# Patient Record
Sex: Male | Born: 1955 | Race: White | Hispanic: No | Marital: Married | Smoking: Never smoker
Health system: Southern US, Community
[De-identification: ages and names within clinical notes are randomized; demographics above are authoritative.]

## PROBLEM LIST (undated history)

## (undated) DIAGNOSIS — K219 Gastro-esophageal reflux disease without esophagitis: Secondary | ICD-10-CM

## (undated) DIAGNOSIS — M1990 Unspecified osteoarthritis, unspecified site: Secondary | ICD-10-CM

## (undated) DIAGNOSIS — H9190 Unspecified hearing loss, unspecified ear: Secondary | ICD-10-CM

## (undated) DIAGNOSIS — I10 Essential (primary) hypertension: Secondary | ICD-10-CM

## (undated) HISTORY — PX: MANDIBLE FRACTURE SURGERY: SHX706

## (undated) HISTORY — DX: Gastro-esophageal reflux disease without esophagitis: K21.9

## (undated) HISTORY — DX: Essential (primary) hypertension: I10

## (undated) HISTORY — PX: TRACHEOSTOMY: SUR1362

## (undated) HISTORY — PX: ORIF FACIAL FRACTURE: SHX2118

---

## 1989-02-04 HISTORY — PX: DENTAL SURGERY: SHX609

## 2013-05-13 ENCOUNTER — Encounter (HOSPITAL_COMMUNITY): Payer: Self-pay | Admitting: Pharmacy Technician

## 2013-05-19 NOTE — H&P (Signed)
  [place] is an 58 y.o. male.    Chief Complaint: left knee pain   HPI: Pt is a 58 y.o. male complaining of left knee pain for multiple years. Pain had continually increased since the beginning. X-rays in the clinic show end-stage arthritic changes of the left knee. Pt has tried various conservative treatments which have failed to alleviate their symptoms, including shots and therapy. Various options are discussed with the patient. Risks, benefits and expectations were discussed with the patient. Patient understand the risks, benefits and expectations and wishes to proceed with surgery.   PCP:  No primary provider on file.  D/C Plans:  Home with HHPT  PMH: No past medical history on file.  PSH: No past surgical history on file.  Social History:  has no tobacco, alcohol, and drug history on file.  Allergies:  Allergies  Allergen Reactions  . Percocet [Oxycodone-Acetaminophen] Other (See Comments)    Mouth ulcers    Medications: No current facility-administered medications for this encounter.   Current Outpatient Prescriptions  Medication Sig Dispense Refill  . ALPRAZolam (XANAX) 0.5 MG tablet Take 0.5 mg by mouth 2 (two) times daily.      Marland Kitchen. aspirin 325 MG tablet Take 325 mg by mouth daily.      . hydrochlorothiazide (HYDRODIURIL) 25 MG tablet Take 25 mg by mouth daily.      . meloxicam (MOBIC) 7.5 MG tablet Take 7.5 mg by mouth 2 (two) times daily.      . potassium chloride (K-DUR) 10 MEQ tablet Take 10 mEq by mouth daily.      . tamsulosin (FLOMAX) 0.4 MG CAPS capsule Take 0.4 mg by mouth daily after supper.        No results found for this or any previous visit (from the past 48 hour(s)). No results found.  ROS: Pain with rom of the left lower extremity  Physical Exam:  Alert and oriented 58 y.o. male in no acute distress Cranial nerves 2-12 intact Cervical spine: full rom with no tenderness, nv intact distally Chest: active breath sounds bilaterally, no  wheeze rhonchi or rales Heart: regular rate and rhythm, no murmur Abd: non tender non distended with active bowel sounds Hip is stable with rom  Moderate tenderness left knee with crepitus with rom nv intact distally No rashes or edema  Assessment/Plan Assessment: left knee end stage osteoarthritis  Plan: Patient will undergo a left total knee arthroplasty [facility]. Risks benefits and expectations were discussed with the patient. Patient understand risks, benefits and expectations and wishes to proceed.

## 2013-05-20 ENCOUNTER — Inpatient Hospital Stay (HOSPITAL_COMMUNITY)
Admission: RE | Admit: 2013-05-20 | Discharge: 2013-05-20 | Disposition: A | Discharge status: Home / Self Care | Payer: Self-pay | Source: Ambulatory Visit

## 2013-05-20 ENCOUNTER — Other Ambulatory Visit (HOSPITAL_COMMUNITY): Payer: Self-pay | Admitting: *Deleted

## 2013-05-20 NOTE — Pre-Procedure Instructions (Signed)
Kyle NationHerbert Christensen  05/20/2013   Your procedure is scheduled on:  Friday, May 28, 2013 at 7:30 AM.   [facility] Office at 5:30 AM.   Call this number if you have problems the morning of surgery: [phone]   Remember:    Do not eat food or drink liquids after midnight Thursday, 05/27/13.   Take these medicines the morning of surgery with A SIP OF WATER: ALPRAZolam Kyle Christensen(XANAX)   Do not wear jewelry.  Do not wear lotions, powders, or cologne. You may wear deodorant.  Men may shave face and neck.  Do not bring valuables to the hospital.  [facility] is not responsible                  for any belongings or valuables.               Contacts, dentures or bridgework may not be worn into surgery.  Leave suitcase in the car. After surgery it may be brought to your room.  For patients admitted to the hospital, discharge time is determined by your                treatment team.               Special Instructions: Kyle Christensen - Preparing for Surgery  Before surgery, you can play an important role.  Because skin is not sterile, your skin needs to be as free of germs as possible.  You can reduce the number of germs on you skin by washing with CHG (chlorahexidine gluconate) soap before surgery.  CHG is an antiseptic cleaner which kills germs and bonds with the skin to continue killing germs even after washing.  Please DO NOT use if you have an allergy to CHG or antibacterial soaps.  If your skin becomes reddened/irritated stop using the CHG and inform your nurse when you arrive at Short Stay.  Do not shave (including legs and underarms) for at least 48 hours prior to the first CHG shower.  You may shave your face.  Please follow these instructions carefully:   1.  Shower with CHG Soap the night before surgery and the                                morning of Surgery.  2.  If you choose to wash your hair, wash your hair first as usual with your       normal  shampoo.  3.  After you shampoo, rinse your hair and body thoroughly to remove the                      Shampoo.  4.  Use CHG as you would any other liquid soap.  You can apply chg directly       to the skin and wash gently with scrungie or a clean washcloth.  5.  Apply the CHG Soap to your body ONLY FROM THE NECK DOWN.        Do not use on open wounds or open sores.  Avoid contact with your eyes, ears, mouth and genitals (private parts).  Wash genitals (private parts) with your normal soap.  6.  Wash thoroughly, paying special attention to the area where your surgery        will be performed.  7.  Thoroughly rinse your body with warm water from  the neck down.  8.  DO NOT shower/wash with your normal soap after using and rinsing off       the CHG Soap.  9.  Pat yourself dry with a clean towel.            10.  Wear clean pajamas.            11.  Place clean sheets on your bed the night of your first shower and do not        sleep with pets.  Day of Surgery  Do not apply any lotions the morning of surgery.  Please wear clean clothes to the hospital/surgery center.     Please read over the following fact sheets that you were given: Pain Booklet, Coughing and Deep Breathing, Blood Transfusion Information, MRSA Information and Surgical Site Infection Prevention

## 2013-05-28 ENCOUNTER — Ambulatory Visit (HOSPITAL_COMMUNITY)
Admission: RE | Admit: 2013-05-28 | Payer: BC Managed Care – PPO | Source: Ambulatory Visit | Admitting: Orthopedic Surgery

## 2013-05-28 ENCOUNTER — Encounter (HOSPITAL_COMMUNITY): Admission: RE | Payer: Self-pay | Source: Ambulatory Visit

## 2013-05-28 SURGERY — ARTHROPLASTY, KNEE, TOTAL
Anesthesia: General | Site: Knee | Laterality: Left

## 2013-07-13 ENCOUNTER — Encounter (HOSPITAL_COMMUNITY): Payer: Self-pay

## 2013-07-13 ENCOUNTER — Encounter (HOSPITAL_COMMUNITY)
Admission: RE | Admit: 2013-07-13 | Discharge: 2013-07-13 | Disposition: A | Discharge status: Home / Self Care | Payer: BC Managed Care – PPO | Source: Ambulatory Visit | Attending: Orthopedic Surgery | Admitting: Orthopedic Surgery

## 2013-07-13 ENCOUNTER — Ambulatory Visit (HOSPITAL_COMMUNITY)
Admission: RE | Admit: 2013-07-13 | Discharge: 2013-07-13 | Disposition: A | Discharge status: Home / Self Care | Payer: BC Managed Care – PPO | Source: Ambulatory Visit | Attending: Anesthesiology | Admitting: Anesthesiology

## 2013-07-13 ENCOUNTER — Encounter (HOSPITAL_COMMUNITY): Payer: Self-pay | Admitting: Pharmacy Technician

## 2013-07-13 DIAGNOSIS — Z01818 Encounter for other preprocedural examination: Secondary | ICD-10-CM | POA: Insufficient documentation

## 2013-07-13 DIAGNOSIS — Z01812 Encounter for preprocedural laboratory examination: Secondary | ICD-10-CM | POA: Insufficient documentation

## 2013-07-13 DIAGNOSIS — I10 Essential (primary) hypertension: Secondary | ICD-10-CM | POA: Insufficient documentation

## 2013-07-13 HISTORY — DX: Unspecified osteoarthritis, unspecified site: M19.90

## 2013-07-13 HISTORY — DX: Unspecified hearing loss, unspecified ear: H91.90

## 2013-07-13 LAB — SURGICAL PCR SCREEN
MRSA, PCR: NEGATIVE
Staphylococcus aureus: NEGATIVE

## 2013-07-13 NOTE — H&P (Signed)
  Kyle Christensen is an 58 y.o. male.    Chief Complaint: left knee pain  HPI: Pt is a 58 y.o. male complaining of left knee pain for multiple years. Pain had continually increased since the beginning. X-rays in the clinic show end-stage arthritic changes of the left knee. Pt has tried various conservative treatments which have failed to alleviate their symptoms, including injections and therapy. Various options are discussed with the patient. Risks, benefits and expectations were discussed with the patient. Patient understand the risks, benefits and expectations and wishes to proceed with surgery.   PCP:  [name], MD  D/C Plans:  Home with HHPT  PMH: Past Medical History  Diagnosis Date  . HOH (hard of hearing)     reads lips  . Arthritis     osteoarthritis    PSH: Past Surgical History  Procedure Laterality Date  . Tracheostomy    . Orif facial fracture      plate  . Mandible fracture surgery    . Dental surgery  1991    replaced all teeth    Social History:  reports that he has never smoked. He does not have any smokeless tobacco history on file. He reports that he does not drink alcohol or use illicit drugs.  Allergies:  Allergies  Allergen Reactions  . Percocet [Oxycodone-Acetaminophen] Other (See Comments)    Mouth ulcers    Medications: No current facility-administered medications for this encounter.   Current Outpatient Prescriptions  Medication Sig Dispense Refill  . ALPRAZolam (XANAX) 0.5 MG tablet Take 1 mg by mouth at bedtime.       . hydrochlorothiazide (HYDRODIURIL) 25 MG tablet Take 25 mg by mouth daily.      . meloxicam (MOBIC) 7.5 MG tablet Take 15 mg by mouth daily.       . potassium chloride (K-DUR) 10 MEQ tablet Take 10 mEq by mouth daily.      . tamsulosin (FLOMAX) 0.4 MG CAPS capsule Take 0.4 mg by mouth daily after supper.      Marland Kitchen aspirin 81 MG tablet Take 81 mg by mouth daily.        No results found for this or any previous visit  (from the past 48 hour(s)). No results found.  ROS: Pain with rom of the left lower extremity  Physical Exam:  Alert and oriented 58 y.o. male in no acute distress Cranial nerves 2-12 intact Cervical spine: full rom with no tenderness, nv intact distally Chest: active breath sounds bilaterally, no wheeze rhonchi or rales Heart: regular rate and rhythm, no murmur Abd: non tender non distended with active bowel sounds Hip is stable with rom  Left knee: moderate crepitus with rom nv intact distally No rashes or edema Slight antlagic gait  Assessment/Plan Assessment: left knee end stage osteoarthritis  Plan: Patient will undergo a left total knee arthroplasty by Dr. Ranell Patrick at [facility]. Risks benefits and expectations were discussed with the patient. Patient understand risks, benefits and expectations and wishes to proceed.

## 2013-07-13 NOTE — Pre-Procedure Instructions (Addendum)
Jametrius Andring  07/13/2013   Your procedure is scheduled on:  Friday, June 19.  Report to [facility] Admitting at 8:30AM.  Call this number if you have problems the morning of surgery: [phone]   Remember:   Do not eat food or drink liquids after midnight Thursday, June 18.   Take these medicines the morning of surgery with A SIP OF WATER: ALPRAZolam Prudy Feeler).              Stop taking Meloxicam, do not taking any Ibuprofen or Aleve, Vitamins or Herbal medication.   Do not wear jewelry, make-up or nail polish.  Do not wear lotions, powders, or perfumes.   Men may shave face and neck.  Do not bring valuables to the hospital.            [facility] is not responsible for any belongings or valuables.               Contacts, dentures or bridgework may not be worn into surgery.  Leave suitcase in the car. After surgery it may be brought to your room.  For patients admitted to the hospital, discharge time is determined by your treatment team.                 Special Instructions: Review   - Preparing For Surgery.   Please read over the following fact sheets that you were given: Pain Booklet, Coughing and Deep Breathing, Blood Transfusion Information and Surgical Site Infection Prevention

## 2013-07-13 NOTE — Progress Notes (Signed)
Mr Fetterolf refused to wear  Blood bank bracelet for a week, I told him he could come in in Thursday before surgery to have labs drawn.  Mrs Sampaio told me while patient was getting chest xray done that patient told her that he is not coming to have labs drawn on Thursday.  I called Dr Ranell Patrick office and spoke with Judie Grieve and received permission for patient to have labs drawn day of surgery.  Mr Tiet also said that he thought they were doing the right knee, consent is for left knee, I informed Judie Grieve of this also and he said that everything he sees is left knee but he would work on it.

## 2013-07-14 NOTE — Progress Notes (Signed)
Info received from [facility], no EKG noted.  Will need EKG the morning of surgery.

## 2013-07-22 MED ORDER — CHLORHEXIDINE GLUCONATE 4 % EX LIQD
60.0000 mL | Freq: Once | CUTANEOUS | Status: DC
  Filled 2013-07-22: qty 60

## 2013-07-22 MED ORDER — CEFAZOLIN SODIUM-DEXTROSE 2-3 GM-% IV SOLR
2.0000 g | INTRAVENOUS | Status: AC
  Administered 2013-07-23: 2 g via INTRAVENOUS
  Filled 2013-07-22: qty 50

## 2013-07-23 ENCOUNTER — Inpatient Hospital Stay (HOSPITAL_COMMUNITY)
Admission: RE | Admit: 2013-07-23 | Discharge: 2013-07-26 | DRG: 470 | Disposition: A | Discharge status: Home Health | Payer: BC Managed Care – PPO | Source: Ambulatory Visit | Attending: Orthopedic Surgery | Admitting: Orthopedic Surgery

## 2013-07-23 ENCOUNTER — Encounter (HOSPITAL_COMMUNITY): Payer: BC Managed Care – PPO | Admitting: Anesthesiology

## 2013-07-23 ENCOUNTER — Encounter (HOSPITAL_COMMUNITY): Payer: Self-pay | Admitting: Anesthesiology

## 2013-07-23 ENCOUNTER — Inpatient Hospital Stay (HOSPITAL_COMMUNITY): Payer: BC Managed Care – PPO | Admitting: Anesthesiology

## 2013-07-23 ENCOUNTER — Encounter (HOSPITAL_COMMUNITY)
Admission: RE | Disposition: A | Discharge status: Home Health | Payer: Self-pay | Source: Ambulatory Visit | Attending: Orthopedic Surgery

## 2013-07-23 ENCOUNTER — Inpatient Hospital Stay (HOSPITAL_COMMUNITY): Payer: BC Managed Care – PPO

## 2013-07-23 DIAGNOSIS — Z7982 Long term (current) use of aspirin: Secondary | ICD-10-CM

## 2013-07-23 DIAGNOSIS — M179 Osteoarthritis of knee, unspecified: Secondary | ICD-10-CM | POA: Diagnosis present

## 2013-07-23 DIAGNOSIS — M1710 Unilateral primary osteoarthritis, unspecified knee: Principal | ICD-10-CM | POA: Diagnosis present

## 2013-07-23 HISTORY — PX: TOTAL KNEE ARTHROPLASTY: SHX125

## 2013-07-23 LAB — PROTIME-INR
INR: 0.99 (ref 0.00–1.49)
PROTHROMBIN TIME: 12.9 s (ref 11.6–15.2)

## 2013-07-23 LAB — CBC
HCT: 44.2 % (ref 39.0–52.0)
HEMOGLOBIN: 15.9 g/dL (ref 13.0–17.0)
MCH: 34 pg (ref 26.0–34.0)
MCHC: 36 g/dL (ref 30.0–36.0)
MCV: 94.6 fL (ref 78.0–100.0)
Platelets: 197 10*3/uL (ref 150–400)
RBC: 4.67 MIL/uL (ref 4.22–5.81)
RDW: 12.1 % (ref 11.5–15.5)
WBC: 7.1 10*3/uL (ref 4.0–10.5)

## 2013-07-23 LAB — BASIC METABOLIC PANEL
BUN: 16 mg/dL (ref 6–23)
CO2: 27 mEq/L (ref 19–32)
Calcium: 9.6 mg/dL (ref 8.4–10.5)
Chloride: 99 mEq/L (ref 96–112)
Creatinine, Ser: 1.16 mg/dL (ref 0.50–1.35)
GFR calc Af Amer: 80 mL/min — ABNORMAL LOW (ref >90)
GFR, EST NON AFRICAN AMERICAN: 69 mL/min — AB (ref >90)
Glucose, Bld: 116 mg/dL — ABNORMAL HIGH (ref 70–99)
POTASSIUM: 3.6 meq/L — AB (ref 3.7–5.3)
SODIUM: 138 meq/L (ref 137–147)

## 2013-07-23 LAB — APTT: aPTT: 32 seconds (ref 24–37)

## 2013-07-23 LAB — TYPE AND SCREEN
ABO/RH(D): O POS
ANTIBODY SCREEN: NEGATIVE

## 2013-07-23 LAB — ABO/RH: ABO/RH(D): O POS

## 2013-07-23 SURGERY — ARTHROPLASTY, KNEE, TOTAL
Anesthesia: General | Site: Knee | Laterality: Right

## 2013-07-23 MED ORDER — ONDANSETRON HCL 4 MG/2ML IJ SOLN
4.0000 mg | Freq: Four times a day (QID) | INTRAMUSCULAR | Status: DC | PRN
  Administered 2013-07-23: 4 mg via INTRAVENOUS
  Filled 2013-07-23: qty 2

## 2013-07-23 MED ORDER — COUMADIN BOOK
Freq: Once | Status: AC
  Administered 2013-07-23: 1
  Filled 2013-07-23: qty 1

## 2013-07-23 MED ORDER — LIDOCAINE HCL (CARDIAC) 20 MG/ML IV SOLN
INTRAVENOUS | Status: DC | PRN
  Administered 2013-07-23: 60 mg via INTRAVENOUS

## 2013-07-23 MED ORDER — DEXTROSE 5 % IV SOLN
500.0000 mg | Freq: Four times a day (QID) | INTRAVENOUS | Status: DC | PRN
  Filled 2013-07-23: qty 5

## 2013-07-23 MED ORDER — POTASSIUM CHLORIDE ER 10 MEQ PO TBCR
10.0000 meq | EXTENDED_RELEASE_TABLET | Freq: Every day | ORAL | Status: DC
  Administered 2013-07-23 – 2013-07-26 (×4): 10 meq via ORAL
  Filled 2013-07-23 (×4): qty 1

## 2013-07-23 MED ORDER — HYDROMORPHONE HCL PF 1 MG/ML IJ SOLN
INTRAMUSCULAR | Status: AC
  Administered 2013-07-23: 0.5 mg via INTRAVENOUS
  Filled 2013-07-23: qty 1

## 2013-07-23 MED ORDER — WARFARIN SODIUM 7.5 MG PO TABS
7.5000 mg | ORAL_TABLET | Freq: Once | ORAL | Status: AC
  Administered 2013-07-23: 7.5 mg via ORAL
  Filled 2013-07-23: qty 1

## 2013-07-23 MED ORDER — FERROUS SULFATE 325 (65 FE) MG PO TABS
325.0000 mg | ORAL_TABLET | Freq: Three times a day (TID) | ORAL | Status: DC
  Administered 2013-07-23 – 2013-07-26 (×8): 325 mg via ORAL
  Filled 2013-07-23 (×12): qty 1

## 2013-07-23 MED ORDER — SODIUM CHLORIDE 0.9 % IV SOLN
INTRAVENOUS | Status: DC
  Administered 2013-07-23: 75 mL/h via INTRAVENOUS
  Administered 2013-07-24: via INTRAVENOUS

## 2013-07-23 MED ORDER — WARFARIN SODIUM 5 MG PO TABS: 5.0000 mg | ORAL_TABLET | Freq: Every day | ORAL | Status: DC

## 2013-07-23 MED ORDER — LACTATED RINGERS IV SOLN: INTRAVENOUS | Status: DC | PRN

## 2013-07-23 MED ORDER — ALBUMIN HUMAN 5 % IV SOLN
INTRAVENOUS | Status: DC | PRN
  Administered 2013-07-23: 11:00:00 via INTRAVENOUS

## 2013-07-23 MED ORDER — SODIUM CHLORIDE 0.9 % IR SOLN
Status: DC | PRN
  Administered 2013-07-23: 1000 mL

## 2013-07-23 MED ORDER — MIDAZOLAM HCL 2 MG/2ML IJ SOLN
INTRAMUSCULAR | Status: AC
  Administered 2013-07-23: 2 mg
  Filled 2013-07-23: qty 2

## 2013-07-23 MED ORDER — ACETAMINOPHEN 650 MG RE SUPP: 650.0000 mg | Freq: Four times a day (QID) | RECTAL | Status: DC | PRN

## 2013-07-23 MED ORDER — ROCURONIUM BROMIDE 50 MG/5ML IV SOLN
INTRAVENOUS | Status: AC
  Filled 2013-07-23: qty 1

## 2013-07-23 MED ORDER — METHOCARBAMOL 500 MG PO TABS
500.0000 mg | ORAL_TABLET | Freq: Four times a day (QID) | ORAL | Status: DC | PRN
  Administered 2013-07-23 – 2013-07-24 (×5): 500 mg via ORAL
  Filled 2013-07-23 (×4): qty 1

## 2013-07-23 MED ORDER — CEFAZOLIN SODIUM-DEXTROSE 2-3 GM-% IV SOLR: 2.0000 g | INTRAVENOUS | Status: DC

## 2013-07-23 MED ORDER — ALPRAZOLAM 0.5 MG PO TABS
1.0000 mg | ORAL_TABLET | Freq: Every day | ORAL | Status: DC
  Administered 2013-07-23 – 2013-07-25 (×3): 1 mg via ORAL
  Filled 2013-07-23 (×3): qty 2

## 2013-07-23 MED ORDER — WARFARIN VIDEO
Freq: Once | Status: AC
  Administered 2013-07-24: 10:00:00

## 2013-07-23 MED ORDER — TAMSULOSIN HCL 0.4 MG PO CAPS
0.4000 mg | ORAL_CAPSULE | Freq: Every day | ORAL | Status: DC
  Administered 2013-07-23 – 2013-07-25 (×3): 0.4 mg via ORAL
  Filled 2013-07-23 (×4): qty 1

## 2013-07-23 MED ORDER — HYDROMORPHONE HCL PF 1 MG/ML IJ SOLN
2.0000 mg | INTRAMUSCULAR | Status: DC | PRN
  Administered 2013-07-23 – 2013-07-25 (×8): 2 mg via INTRAVENOUS
  Filled 2013-07-23 (×10): qty 2

## 2013-07-23 MED ORDER — SUCCINYLCHOLINE CHLORIDE 20 MG/ML IJ SOLN
INTRAMUSCULAR | Status: DC | PRN
  Administered 2013-07-23: 100 mg via INTRAVENOUS

## 2013-07-23 MED ORDER — ONDANSETRON HCL 4 MG/2ML IJ SOLN
INTRAMUSCULAR | Status: DC | PRN
  Administered 2013-07-23: 4 mg via INTRAVENOUS

## 2013-07-23 MED ORDER — FENTANYL CITRATE 0.05 MG/ML IJ SOLN
INTRAMUSCULAR | Status: AC
  Administered 2013-07-23: 50 ug via INTRAVENOUS
  Filled 2013-07-23: qty 2

## 2013-07-23 MED ORDER — WARFARIN - PHARMACIST DOSING INPATIENT: Freq: Every day | Status: DC

## 2013-07-23 MED ORDER — ACETAMINOPHEN 325 MG PO TABS
650.0000 mg | ORAL_TABLET | Freq: Four times a day (QID) | ORAL | Status: DC | PRN
  Administered 2013-07-24 – 2013-07-26 (×5): 650 mg via ORAL
  Filled 2013-07-23 (×5): qty 2

## 2013-07-23 MED ORDER — METOCLOPRAMIDE HCL 10 MG PO TABS
5.0000 mg | ORAL_TABLET | Freq: Three times a day (TID) | ORAL | Status: DC | PRN
  Administered 2013-07-23 – 2013-07-25 (×3): 10 mg via ORAL
  Filled 2013-07-23 (×3): qty 1

## 2013-07-23 MED ORDER — NEOSTIGMINE METHYLSULFATE 10 MG/10ML IV SOLN
INTRAVENOUS | Status: AC
  Filled 2013-07-23: qty 1

## 2013-07-23 MED ORDER — DEXAMETHASONE SODIUM PHOSPHATE 4 MG/ML IJ SOLN
INTRAMUSCULAR | Status: DC | PRN
  Administered 2013-07-23: 4 mg via INTRAVENOUS

## 2013-07-23 MED ORDER — ONDANSETRON HCL 4 MG/2ML IJ SOLN
INTRAMUSCULAR | Status: AC
  Filled 2013-07-23: qty 2

## 2013-07-23 MED ORDER — LIDOCAINE HCL (CARDIAC) 20 MG/ML IV SOLN
INTRAVENOUS | Status: AC
  Filled 2013-07-23: qty 5

## 2013-07-23 MED ORDER — HYDROCHLOROTHIAZIDE 25 MG PO TABS
25.0000 mg | ORAL_TABLET | Freq: Every day | ORAL | Status: DC
  Administered 2013-07-24 – 2013-07-26 (×3): 25 mg via ORAL
  Filled 2013-07-23 (×3): qty 1

## 2013-07-23 MED ORDER — PHENOL 1.4 % MT LIQD: 1.0000 | OROMUCOSAL | Status: DC | PRN

## 2013-07-23 MED ORDER — METHOCARBAMOL 500 MG PO TABS
ORAL_TABLET | ORAL | Status: AC
  Administered 2013-07-23: 500 mg via ORAL
  Filled 2013-07-23: qty 1

## 2013-07-23 MED ORDER — FENTANYL CITRATE 0.05 MG/ML IJ SOLN
INTRAMUSCULAR | Status: DC | PRN
  Administered 2013-07-23 (×2): 50 ug via INTRAVENOUS

## 2013-07-23 MED ORDER — PROPOFOL 10 MG/ML IV BOLUS
INTRAVENOUS | Status: AC
  Filled 2013-07-23: qty 20

## 2013-07-23 MED ORDER — METOCLOPRAMIDE HCL 5 MG/ML IJ SOLN: 5.0000 mg | Freq: Three times a day (TID) | INTRAMUSCULAR | Status: DC | PRN

## 2013-07-23 MED ORDER — ASPIRIN 81 MG PO CHEW
81.0000 mg | CHEWABLE_TABLET | Freq: Every day | ORAL | Status: DC
  Administered 2013-07-23 – 2013-07-26 (×4): 81 mg via ORAL
  Filled 2013-07-23 (×4): qty 1

## 2013-07-23 MED ORDER — HYDROMORPHONE HCL 2 MG PO TABS: 2.0000 mg | ORAL_TABLET | ORAL | Status: DC | PRN

## 2013-07-23 MED ORDER — PHENYLEPHRINE HCL 10 MG/ML IJ SOLN
INTRAMUSCULAR | Status: DC | PRN
  Administered 2013-07-23: 80 ug via INTRAVENOUS
  Administered 2013-07-23 (×2): 40 ug via INTRAVENOUS

## 2013-07-23 MED ORDER — LACTATED RINGERS IV SOLN
INTRAVENOUS | Status: DC
  Administered 2013-07-23 (×2): via INTRAVENOUS

## 2013-07-23 MED ORDER — DEXAMETHASONE SODIUM PHOSPHATE 4 MG/ML IJ SOLN
INTRAMUSCULAR | Status: AC
  Filled 2013-07-23: qty 1

## 2013-07-23 MED ORDER — OXYCODONE HCL 5 MG/5ML PO SOLN: 5.0000 mg | Freq: Once | ORAL | Status: DC | PRN

## 2013-07-23 MED ORDER — PROPOFOL 10 MG/ML IV BOLUS
INTRAVENOUS | Status: DC | PRN
  Administered 2013-07-23: 200 mg via INTRAVENOUS
  Administered 2013-07-23: 20 mg via INTRAVENOUS

## 2013-07-23 MED ORDER — OXYCODONE HCL 5 MG PO TABS: 5.0000 mg | ORAL_TABLET | Freq: Once | ORAL | Status: DC | PRN

## 2013-07-23 MED ORDER — HYDROMORPHONE HCL PF 1 MG/ML IJ SOLN
0.2500 mg | INTRAMUSCULAR | Status: DC | PRN
  Administered 2013-07-23 (×4): 0.5 mg via INTRAVENOUS

## 2013-07-23 MED ORDER — FENTANYL CITRATE 0.05 MG/ML IJ SOLN
INTRAMUSCULAR | Status: AC
  Filled 2013-07-23: qty 5

## 2013-07-23 MED ORDER — CEFAZOLIN SODIUM-DEXTROSE 2-3 GM-% IV SOLR
2.0000 g | Freq: Four times a day (QID) | INTRAVENOUS | Status: AC
  Administered 2013-07-23 (×2): 2 g via INTRAVENOUS
  Filled 2013-07-23 (×3): qty 50

## 2013-07-23 MED ORDER — MENTHOL 3 MG MT LOZG: 1.0000 | LOZENGE | OROMUCOSAL | Status: DC | PRN

## 2013-07-23 MED ORDER — BUPIVACAINE HCL (PF) 0.5 % IJ SOLN
INTRAMUSCULAR | Status: DC | PRN
  Administered 2013-07-23: 25 mL via PERINEURAL

## 2013-07-23 MED ORDER — HYDROMORPHONE HCL 2 MG PO TABS
2.0000 mg | ORAL_TABLET | ORAL | Status: DC | PRN
  Administered 2013-07-24 (×4): 4 mg via ORAL
  Filled 2013-07-23 (×4): qty 2

## 2013-07-23 MED ORDER — 0.9 % SODIUM CHLORIDE (POUR BTL) OPTIME
TOPICAL | Status: DC | PRN
  Administered 2013-07-23: 1000 mL

## 2013-07-23 MED ORDER — PHENYLEPHRINE HCL 10 MG/ML IJ SOLN
10.0000 mg | INTRAVENOUS | Status: DC | PRN
  Administered 2013-07-23: 10 ug/min via INTRAVENOUS

## 2013-07-23 MED ORDER — LIDOCAINE HCL 4 % MT SOLN
OROMUCOSAL | Status: DC | PRN
  Administered 2013-07-23: 4 mL via TOPICAL

## 2013-07-23 MED ORDER — METOCLOPRAMIDE HCL 5 MG/ML IJ SOLN: 10.0000 mg | Freq: Once | INTRAMUSCULAR | Status: DC | PRN

## 2013-07-23 MED ORDER — FENTANYL CITRATE 0.05 MG/ML IJ SOLN
50.0000 ug | Freq: Once | INTRAMUSCULAR | Status: AC
  Administered 2013-07-23: 50 ug via INTRAVENOUS

## 2013-07-23 MED ORDER — GLYCOPYRROLATE 0.2 MG/ML IJ SOLN
INTRAMUSCULAR | Status: AC
  Filled 2013-07-23: qty 3

## 2013-07-23 MED ORDER — ONDANSETRON HCL 4 MG PO TABS
4.0000 mg | ORAL_TABLET | Freq: Four times a day (QID) | ORAL | Status: DC | PRN
  Administered 2013-07-24 – 2013-07-26 (×3): 4 mg via ORAL
  Filled 2013-07-23 (×3): qty 1

## 2013-07-23 MED ORDER — METHOCARBAMOL 500 MG PO TABS: 500.0000 mg | ORAL_TABLET | Freq: Three times a day (TID) | ORAL | Status: DC | PRN

## 2013-07-23 MED ORDER — MIDAZOLAM HCL 2 MG/2ML IJ SOLN: 2.0000 mg | Freq: Once | INTRAMUSCULAR | Status: DC

## 2013-07-23 SURGICAL SUPPLY — 55 items
BANDAGE ELASTIC 6 VELCRO ST LF (GAUZE/BANDAGES/DRESSINGS) ×3 IMPLANT
BANDAGE ESMARK 6X9 LF (GAUZE/BANDAGES/DRESSINGS) ×1 IMPLANT
BANDAGE GAUZE ELAST BULKY 4 IN (GAUZE/BANDAGES/DRESSINGS) ×3 IMPLANT
BLADE SAG 18X100X1.27 (BLADE) ×3 IMPLANT
BLADE SAW SGTL 13.0X1.19X90.0M (BLADE) ×3 IMPLANT
BNDG ELASTIC 6X10 VLCR STRL LF (GAUZE/BANDAGES/DRESSINGS) ×3 IMPLANT
BNDG ESMARK 6X9 LF (GAUZE/BANDAGES/DRESSINGS) ×3
BOWL SMART MIX CTS (DISPOSABLE) ×3 IMPLANT
CAPT RP KNEE ×3 IMPLANT
CEMENT HV SMART SET (Cement) ×6 IMPLANT
CLOSURE WOUND 1/2 X4 (GAUZE/BANDAGES/DRESSINGS) ×1
COVER SURGICAL LIGHT HANDLE (MISCELLANEOUS) ×3 IMPLANT
CUFF TOURNIQUET SINGLE 34IN LL (TOURNIQUET CUFF) ×3 IMPLANT
CUFF TOURNIQUET SINGLE 44IN (TOURNIQUET CUFF) IMPLANT
DRAPE EXTREMITY T 121X128X90 (DRAPE) ×3 IMPLANT
DRAPE PROXIMA HALF (DRAPES) ×3 IMPLANT
DRAPE U-SHAPE 47X51 STRL (DRAPES) ×3 IMPLANT
DRSG ADAPTIC 3X8 NADH LF (GAUZE/BANDAGES/DRESSINGS) ×3 IMPLANT
DRSG PAD ABDOMINAL 8X10 ST (GAUZE/BANDAGES/DRESSINGS) ×6 IMPLANT
DURAPREP 26ML APPLICATOR (WOUND CARE) ×3 IMPLANT
ELECT CAUTERY BLADE 6.4 (BLADE) ×3 IMPLANT
ELECT REM PT RETURN 9FT ADLT (ELECTROSURGICAL) ×3
ELECTRODE REM PT RTRN 9FT ADLT (ELECTROSURGICAL) ×1 IMPLANT
GLOVE BIOGEL PI ORTHO PRO 7.5 (GLOVE) ×2
GLOVE BIOGEL PI ORTHO PRO SZ8 (GLOVE) ×2
GLOVE ORTHO TXT STRL SZ7.5 (GLOVE) ×3 IMPLANT
GLOVE PI ORTHO PRO STRL 7.5 (GLOVE) ×1 IMPLANT
GLOVE PI ORTHO PRO STRL SZ8 (GLOVE) ×1 IMPLANT
GLOVE SURG ORTHO 8.5 STRL (GLOVE) ×3 IMPLANT
GOWN STRL REUS W/ TWL XL LVL3 (GOWN DISPOSABLE) ×3 IMPLANT
GOWN STRL REUS W/TWL XL LVL3 (GOWN DISPOSABLE) ×6
HANDPIECE INTERPULSE COAX TIP (DISPOSABLE) ×2
IMMOBILIZER KNEE 22 UNIV (SOFTGOODS) ×3 IMPLANT
KIT BASIN OR (CUSTOM PROCEDURE TRAY) ×3 IMPLANT
KIT MANIFOLD (MISCELLANEOUS) ×3 IMPLANT
KIT ROOM TURNOVER OR (KITS) ×3 IMPLANT
MANIFOLD NEPTUNE II (INSTRUMENTS) ×3 IMPLANT
NS IRRIG 1000ML POUR BTL (IV SOLUTION) ×3 IMPLANT
PACK TOTAL JOINT (CUSTOM PROCEDURE TRAY) ×3 IMPLANT
PAD ARMBOARD 7.5X6 YLW CONV (MISCELLANEOUS) ×6 IMPLANT
SET HNDPC FAN SPRY TIP SCT (DISPOSABLE) ×1 IMPLANT
SPONGE GAUZE 4X4 12PLY (GAUZE/BANDAGES/DRESSINGS) ×3 IMPLANT
STRIP CLOSURE SKIN 1/2X4 (GAUZE/BANDAGES/DRESSINGS) ×2 IMPLANT
SUCTION FRAZIER TIP 10 FR DISP (SUCTIONS) ×3 IMPLANT
SUT MNCRL AB 3-0 PS2 18 (SUTURE) ×3 IMPLANT
SUT VIC AB 0 CT1 27 (SUTURE) ×4
SUT VIC AB 0 CT1 27XBRD ANBCTR (SUTURE) ×2 IMPLANT
SUT VIC AB 1 CT1 27 (SUTURE) ×6
SUT VIC AB 1 CT1 27XBRD ANBCTR (SUTURE) ×3 IMPLANT
SUT VIC AB 2-0 CT1 27 (SUTURE) ×4
SUT VIC AB 2-0 CT1 TAPERPNT 27 (SUTURE) ×2 IMPLANT
TOWEL OR 17X24 6PK STRL BLUE (TOWEL DISPOSABLE) ×3 IMPLANT
TOWEL OR 17X26 10 PK STRL BLUE (TOWEL DISPOSABLE) ×3 IMPLANT
TRAY FOLEY CATH 16FRSI W/METER (SET/KITS/TRAYS/PACK) ×3 IMPLANT
WATER STERILE IRR 1000ML POUR (IV SOLUTION) ×6 IMPLANT

## 2013-07-23 NOTE — Progress Notes (Signed)
ANTICOAGULATION CONSULT NOTE - Initial Consult  Pharmacy Consult for Coumadin Indication: VTE prophylaxis s/p Right TKA  Allergies  Allergen Reactions  . Percocet [Oxycodone-Acetaminophen] Other (See Comments)    Mouth ulcers    Patient Measurements: Height: 6' (182.9 cm) Weight: 242 lb (109.77 kg) IBW/kg (Calculated) : 77.6   Vital Signs: Temp: 98.2 F (36.8 C) (06/19 1430) Temp src: Oral (06/19 1430) BP: 135/77 mmHg (06/19 1430) Pulse Rate: 101 (06/19 1430)  Labs:  Recent Labs  07/23/13 0834  HGB 15.9  HCT 44.2  PLT 197  APTT 32  LABPROT 12.9  INR 0.99  CREATININE 1.16    Estimated Creatinine Clearance: 91 ml/min (by C-G formula based on Cr of 1.16).   Medical History: Past Medical History  Diagnosis Date  . HOH (hard of hearing)     reads lips  . Arthritis     osteoarthritis    Medications:  Prescriptions prior to admission  Medication Sig Dispense Refill  . ALPRAZolam (XANAX) 0.5 MG tablet Take 1 mg by mouth at bedtime.       Marland Kitchen. aspirin 81 MG tablet Take 81 mg by mouth daily.      . hydrochlorothiazide (HYDRODIURIL) 25 MG tablet Take 25 mg by mouth daily.      . meloxicam (MOBIC) 7.5 MG tablet Take 15 mg by mouth daily.       . potassium chloride (K-DUR) 10 MEQ tablet Take 10 mEq by mouth daily.      . tamsulosin (FLOMAX) 0.4 MG CAPS capsule Take 0.4 mg by mouth daily after supper.        Assessment: 58 y.o male with right knee OA, end stage who is now s/p R. TKA.  Coumadin starting for DVT prophylaxis. Baseline INR = 0.99.  No history of bleeding complications reported.   Goal of Therapy:  INR 2-3 Monitor platelets by anticoagulation protocol: Yes   Plan:  Coumadin 7.5 mg po tonight x1 Daily Protime/INR check. Coumadin education book and video ordered.  Noah Delaineuth Clark, RPh Clinical Pharmacist Pager: [phone] 07/23/2013,4:26 PM

## 2013-07-23 NOTE — Anesthesia Procedure Notes (Addendum)
Anesthesia Regional Block:  Femoral nerve block  Pre-Anesthetic Checklist: ,, timeout performed, Correct Patient, Correct Site, Correct Laterality, Correct Procedure, Correct Position, site marked, Risks and benefits discussed,  Surgical consent,  Pre-op evaluation,  At surgeon's request and post-op pain management  Laterality: Right  Prep: chloraprep       Needles:   Needle Type: Other     Needle Length: 9cm 9 cm Needle Gauge: 21 and 21 G    Additional Needles:  Procedures: ultrasound guided (picture in chart) Femoral nerve block Narrative:  Start time: 07/23/2013 9:00 AM End time: 07/23/2013 9:09 AM Injection made incrementally with aspirations every 5 mL.  Performed by: Personally  Anesthesiologist: C. Frederick MD  Additional Notes: Ultrasound guidance used to: id relevant anatomy, confirm needle position, local anesthetic spread, avoidance of vascular puncture. Picture saved. No complications. Block performed personally by Janetta Horaharles E. Gelene MinkFrederick, MD     Procedure Name: Intubation Date/Time: 07/23/2013 10:25 AM Performed by: Quentin OreWALKER, Mckinleigh Schuchart E Pre-anesthesia Checklist: Patient identified, Emergency Drugs available, Suction available, Patient being monitored and Timeout performed Patient Re-evaluated:Patient Re-evaluated prior to inductionOxygen Delivery Method: Circle system utilized Preoxygenation: Pre-oxygenation with 100% oxygen Intubation Type: IV induction and Rapid sequence Tube type: Oral Tube size: 7.5 mm Number of attempts: 1 Airway Equipment and Method: Stylet and Video-laryngoscopy Placement Confirmation: ETT inserted through vocal cords under direct vision,  positive ETCO2 and breath sounds checked- equal and bilateral Secured at: 23 cm Tube secured with: Tape Dental Injury: Teeth and Oropharynx as per pre-operative assessment  Comments: Electively used glidescope per Dr. Gelene MinkFrederick request. AOI with glidescope, grade I view on screen. +ETCO2 and BBS=.

## 2013-07-23 NOTE — Interval H&P Note (Signed)
History and Physical Interval Note:  07/23/2013 10:00 AM  Kyle Christensen  has presented today for surgery, with the diagnosis of LEFT KNEE OA  The various methods of treatment have been discussed with the patient and family. After consideration of risks, benefits and other options for treatment, the patient has consented to  Procedure(s): RIGHT TOTAL KNEE ARTHROPLASTY (Right) as a surgical intervention .  The patient's history has been reviewed, patient examined, no change in status, stable for surgery.  I have reviewed the patient's chart and labs.  Questions were answered to the patient's satisfaction.     NORRIS,STEVEN R

## 2013-07-23 NOTE — Evaluation (Signed)
Physical Therapy Evaluation Patient Details Name: Kyle Christensen MRN: [id number] DOB: 09/27/1955 Today's Date: 07/23/2013   History of Present Illness  Admitted for elective R TKA  Clinical Impression  Pt admitted with/for elective TKA.  Pt currently limited functionally due to the problems listed below.  (see problems list.)  Pt will benefit from PT to maximize function and safety to be able to get home safely with available assist of family.     Follow Up Recommendations Home health PT    Equipment Recommendations   (TBA--failed to determine what equipment they had.)    Recommendations for Other Services       Precautions / Restrictions Precautions Precautions: Knee Required Braces or Orthoses: Knee Immobilizer - Right Restrictions Weight Bearing Restrictions: Yes RLE Weight Bearing: Weight bearing as tolerated      Mobility  Bed Mobility Overal bed mobility: Needs Assistance Bed Mobility: Supine to Sit     Supine to sit: Min assist     General bed mobility comments: Bridged independently to EOB, min trunk assist to sit up.  Transfers Overall transfer level: Needs assistance Equipment used: Rolling walker (2 wheeled) Transfers: Sit to/from Stand Sit to Stand: Min assist         General transfer comment: cues for safety/ hand placement  Ambulation/Gait Ambulation/Gait assistance: Min assist;Min guard Ambulation Distance (Feet): 40 Feet Assistive device: Rolling walker (2 wheeled) Gait Pattern/deviations: Step-through pattern Gait velocity: too fast for control   General Gait Details: Frequent cues for resequencing. Pt mildly unsteady and going to fast for safety  Stairs            Wheelchair Mobility    Modified Rankin (Stroke Patients Only)       Balance Overall balance assessment: Needs assistance Sitting-balance support: No upper extremity supported Sitting balance-Leahy Scale: Good     Standing balance support: No upper extremity  supported Standing balance-Leahy Scale: Fair Standing balance comment: kept show therapist that he could stand on the surgical leg with no hands                             Pertinent Vitals/Pain     Home Living Family/patient expects to be discharged to:: Private residence Living Arrangements: Spouse/significant other Available Help at Discharge: Family Type of Home: House Home Access: Stairs to enter Entrance Stairs-Rails: Doctor, general practiceight;Left Entrance Stairs-Number of Steps: several Home Layout: One level        Prior Function Level of Independence: Independent               Hand Dominance        Extremity/Trunk Assessment               Lower Extremity Assessment: RLE deficits/detail RLE Deficits / Details: active assist to hip/knee flexion 80*, moves leg against gravity       Communication   Communication: No difficulties  Cognition Arousal/Alertness: Awake/alert Behavior During Therapy: WFL for tasks assessed/performed Overall Cognitive Status: Within Functional Limits for tasks assessed                      General Comments      Exercises Total Joint Exercises Ankle Circles/Pumps: AROM;Other reps (comment) (40-50) Quad Sets: AROM;Both;10 reps;Supine Heel Slides: AAROM;Right;10 reps;Supine Goniometric ROM: 85* at EOB      Assessment/Plan    PT Assessment Patient needs continued PT services  PT Diagnosis Difficulty walking;Generalized weakness;Acute pain   PT Problem  List Decreased strength;Decreased range of motion;Decreased activity tolerance;Decreased mobility;Decreased knowledge of use of DME;Decreased knowledge of precautions;Pain  PT Treatment Interventions DME instruction;Gait training;Stair training;Functional mobility training;Therapeutic activities;Therapeutic exercise;Patient/family education   PT Goals (Current goals can be found in the Care Plan section) Acute Rehab PT Goals Patient Stated Goal: home independent PT  Goal Formulation: With patient Time For Goal Achievement: 07/30/13 Potential to Achieve Goals: Good    Frequency 7X/week   Barriers to discharge        Co-evaluation               End of Session   Activity Tolerance: Patient tolerated treatment well;Other (comment) (c/o of some dizziness) Patient left: in chair;with call bell/phone within reach;with family/visitor present Nurse Communication: Mobility status         Time: [id number] PT Time Calculation (min): 28 min   Charges:   PT Evaluation $Initial PT Evaluation Tier I: 1 Procedure PT Treatments $Gait Training: 8-22 mins   PT G Codes:          Kyle Christensen, Eliseo GumKenneth V 07/23/2013, 5:00 PM 07/23/2013  [place] BingKen Shraga Custard, PT [phone]  (pager)

## 2013-07-23 NOTE — Op Note (Signed)
NAMRaleigh Nation:  Michl, Yehya              ACCOUNT NO.:  [id number]  MEDICAL RECORD NO.:  [id number]  LOCATION:  [id number]                        FACILITY:  [facility]  PHYSICIAN:  Almedia BallsSteven R. Ranell PatrickNorris, M.D. DATE OF BIRTH:  Aug 02, 1955  DATE OF PROCEDURE:  07/23/2013 DATE OF DISCHARGE:                              OPERATIVE REPORT   PREOPERATIVE DIAGNOSIS:  Right knee end-stage osteoarthritis.  POSTOPERATIVE DIAGNOSIS:  Right knee end-stage osteoarthritis.  PROCEDURE PERFORMED:  Right total knee replacement using DePuy Sigma rotating platform prosthesis.  ATTENDING SURGEON:  Almedia BallsSteven R. Ranell PatrickNorris, MD.  ASSISTANT:  Donnie Coffinhomas B. Dixon, PA-C, who was scrubbed during the entire procedure and necessary for satisfactory completion of surgery.  ANESTHESIA:  General anesthesia was used plus femoral block.  ESTIMATED BLOOD LOSS:  Minimal.  FLUID REPLACED:  1005 mL crystalloids.  INSTRUMENT COUNTS:  Correct.  COMPLICATIONS:  There were no complications.  ANTIBIOTICS:  Perioperative antibiotics were given.  INDICATIONS:  The patient is a 58 year old male with worsening right knee arthritis.  The patient has disabling pain and is only able to walk a block prior to having to look for a place to sit down due to his knee arthritis.  The patient has failed all measures of conservative management including injections, modification of activity, anti- inflammatories, and presents for operative treatment to restore function, eliminate pain, and informed consent obtained.  DESCRIPTION OF PROCEDURE:  After adequate level of anesthesia was achieved.  The patient was positioned supine on the operative room table.  Right leg correctly identified.  Nonsterile tourniquet placed on proximal thigh.  Right leg sterilely prepped and draped in usual manner. Left leg padded appropriately.  Time-out called.  We exsanguinated the limb using Esmarch bandage and elevated the tourniquet to 350 mmHg. Longitudinal midline incision was  created with the knee in flexion. Dissection down through subcutaneous tissues using the knife, we identified the median parapatellar tissues and performed a medial parapatellar arthrotomy with a fresh #10 blade scalpel, everting the patella and dividing the lateral patellofemoral ligaments.  We entered the distal femur using a step-cut drill and placed our intramedullary distal femoral resection guide, set on 5 degrees right with 11 mm resection as the patient had a 10-degree flexion contracture.  We next sized the femur size 4 anterior down, we performed our anterior and posterior and chamfer cuts using the 4 in 1 block.  We resected ACL, PCL, and meniscal tissue and subluxed the tibia anteriorly, placed our extramedullary guide for the tibia and cut the tibia 90 degrees perpendicular long axis of the tibia with 4 mm off the affected medial side.  We then went ahead and checked our flexion and extension gaps, which were symmetric at 12.5 mm.  We then went ahead and completed our tibial preparation with the modular drilling keel punch and then placed our cutting block on the femur for the box cut, which we performed for the size 4 right femoral prosthesis.  We removed the box cutting guide, placed our right size 4 femoral trial and then reduced the knee with 12.5 poly and replaced alignments, soft tissue balancing of the knee. We went ahead and placed the knee in extension and resurfaced our patella  starting at 26 mm thickness down to 17 mm thickness.  We resected with an oscillating saw and patellar jig.  Then, we drilled our lug holes for the patellar component.  We placed the 38 patella in position and ranged the knee.  We had nice patellar tracking with no- touch technique.  We removed all trial components, pulse irrigated the knee and then dried the knee and then we cemented the components into place using DePuy high viscosity cement.  Once the cement was allowed to harden, we  removed excess cement using quarter-inch curved osteotome. We trialed with a 12.5, we then trialed with a 15 and could get the knee in full extension of at 15 and this gave us better flexion stability, so wKoreae removed the trial 15 and then selected real 15 polyethylene insert size 4, and placed that onto the tibial tray.  We thoroughly pulse irrigated the knee prior to doing that, we reduced the knee and replaced again with balance and ability come to full extension, alignment, and stability.  We went ahead and then irrigated thoroughly with pulse irrigator and then repaired the median parapatellar arthrotomy with interrupted #1 Vicryl suture followed by 0 and 2-0 Vicryl layered subcutaneous closure and 4-0 running Monocryl for skin.  Steri-Strips applied followed by sterile dressing.  The patient tolerated the surgery well.     Almedia BallsSteven R. Ranell PatrickNorris, M.D.     SRN/MEDQ  D:  07/23/2013  T:  07/23/2013  Job:  [id number]

## 2013-07-23 NOTE — Anesthesia Postprocedure Evaluation (Signed)
  Anesthesia Post-op Note  Patient: Kyle NationHerbert Christensen  Procedure(s) Performed: Procedure(s): RIGHT TOTAL KNEE ARTHROPLASTY (Right)  Patient Location: PACU  Anesthesia Type:General  Level of Consciousness: awake and alert   Airway and Oxygen Therapy: Patient Spontanous Breathing  Post-op Pain: moderate  Post-op Assessment: Post-op Vital signs reviewed, Patient's Cardiovascular Status Stable, Patent Airway, No signs of Nausea or vomiting and Pain level controlled  Post-op Vital Signs: Reviewed and stable  Last Vitals:  Filed Vitals:   07/23/13 1355  BP:   Pulse:   Temp: 36.7 C  Resp:     Complications: No apparent anesthesia complications

## 2013-07-23 NOTE — Progress Notes (Signed)
Orthopedic Tech Progress Note Patient Details:  [place] NationHerbert Christensen 08/21/1955 [id number] Applied CPM to RLE. CPM Right Knee CPM Right Knee: On Right Knee Flexion (Degrees): 60 Right Knee Extension (Degrees): 0   Lesle ChrisGilliland, Roy L 07/23/2013, 1:56 PM

## 2013-07-23 NOTE — Brief Op Note (Signed)
07/23/2013  12:28 PM  PATIENT:  [place] NationHerbert Kernen  58 y.o. male  PRE-OPERATIVE DIAGNOSIS:  right KNEE OA, end stage  POST-OPERATIVE DIAGNOSIS:  right knee OA, end stage  PROCEDURE:  Procedure(s): RIGHT TOTAL KNEE ARTHROPLASTY (Right) DePuy Sigma RP  SURGEON:  Surgeon(s) and Role:    * Verlee RossettiSteven R Norris, MD - Primary  PHYSICIAN ASSISTANT:   ASSISTANTS: Thea Gisthomas B Dixon. PA-C    ANESTHESIA:  General, femoral block  EBL:  Total I/O In: 1250 [I.V.:1000; IV Piggyback:250] Out: 400 [Urine:400]  BLOOD ADMINISTERED:none  DRAINS: none  LOCAL MEDICATIONS USED:  none  SPECIMEN:  none  DISPOSITION OF SPECIMEN:  n/a  COUNTS:  correct  TOURNIQUET:   Total Tourniquet Time Documented: Thigh (Right) - 96 minutes Total: Thigh (Right) - 96 minutes   DICTATION: .other dictation # [id number]  PLAN OF CARE: home  PATIENT DISPOSITION:  home   Delay start of Pharmacological VTE agent (>24hrs) due to surgical blood loss or risk of bleeding: no

## 2013-07-23 NOTE — Anesthesia Preprocedure Evaluation (Signed)
Anesthesia Evaluation  Patient identified by MRN, date of birth, ID band Patient awake    Reviewed: Allergy & Precautions, H&P , NPO status , Patient's Chart, lab work & pertinent test results, reviewed documented beta blocker date and time   Airway Mallampati: II TM Distance: >3 FB Neck ROM: full    Dental   Pulmonary neg pulmonary ROS,  breath sounds clear to auscultation        Cardiovascular negative cardio ROS  Rhythm:regular     Neuro/Psych negative neurological ROS  negative psych ROS   GI/Hepatic negative GI ROS, Neg liver ROS,   Endo/Other  negative endocrine ROS  Renal/GU negative Renal ROS  negative genitourinary   Musculoskeletal   Abdominal   Peds  Hematology negative hematology ROS (+)   Anesthesia Other Findings See surgeon's H&P   Reproductive/Obstetrics negative OB ROS                           Anesthesia Physical Anesthesia Plan  ASA: II  Anesthesia Plan: General   Post-op Pain Management:    Induction: Intravenous  Airway Management Planned: Oral ETT  Additional Equipment:   Intra-op Plan:   Post-operative Plan: Extubation in OR  Informed Consent: I have reviewed the patients History and Physical, chart, labs and discussed the procedure including the risks, benefits and alternatives for the proposed anesthesia with the patient or authorized representative who has indicated his/her understanding and acceptance.   Dental Advisory Given  Plan Discussed with: CRNA and Surgeon  Anesthesia Plan Comments:         Anesthesia Quick Evaluation  

## 2013-07-23 NOTE — Progress Notes (Signed)
Orthopedic Tech Progress Note Patient Details:  [name] 01/06/1956 [id number] On cpm at 8:05 pm RLE 0-60 Patient ID: [name], male   DOB: 04/13/1955, 58 y.o.   MRN: [id number]   Jennye MoccasinHughes, Anthony Craig 07/23/2013, 8:07 PM

## 2013-07-23 NOTE — Transfer of Care (Signed)
Immediate Anesthesia Transfer of Care Note  Patient: Kyle Christensen  Procedure(s) Performed: Procedure(s): RIGHT TOTAL KNEE ARTHROPLASTY (Right)  Patient Location: PACU  Anesthesia Type:General  Level of Consciousness: awake, alert  and oriented  Airway & Oxygen Therapy: Patient Spontanous Breathing and Patient connected to face mask oxygen  Post-op Assessment: Report given to PACU RN, Post -op Vital signs reviewed and stable and Patient moving all extremities X 4  Post vital signs: Reviewed and stable  Complications: No apparent anesthesia complications

## 2013-07-24 LAB — PROTIME-INR
INR: 1.06 (ref 0.00–1.49)
Prothrombin Time: 13.6 seconds (ref 11.6–15.2)

## 2013-07-24 LAB — BASIC METABOLIC PANEL
BUN: 15 mg/dL (ref 6–23)
CALCIUM: 9 mg/dL (ref 8.4–10.5)
CO2: 29 mEq/L (ref 19–32)
CREATININE: 1.14 mg/dL (ref 0.50–1.35)
Chloride: 99 mEq/L (ref 96–112)
GFR calc non Af Amer: 70 mL/min — ABNORMAL LOW (ref >90)
GFR, EST AFRICAN AMERICAN: 81 mL/min — AB (ref >90)
Glucose, Bld: 130 mg/dL — ABNORMAL HIGH (ref 70–99)
Potassium: 4.5 mEq/L (ref 3.7–5.3)
Sodium: 138 mEq/L (ref 137–147)

## 2013-07-24 LAB — CBC
HEMATOCRIT: 39.3 % (ref 39.0–52.0)
Hemoglobin: 13.8 g/dL (ref 13.0–17.0)
MCH: 33.7 pg (ref 26.0–34.0)
MCHC: 35.1 g/dL (ref 30.0–36.0)
MCV: 95.9 fL (ref 78.0–100.0)
PLATELETS: 240 10*3/uL (ref 150–400)
RBC: 4.1 MIL/uL — ABNORMAL LOW (ref 4.22–5.81)
RDW: 12.1 % (ref 11.5–15.5)
WBC: 13.4 10*3/uL — ABNORMAL HIGH (ref 4.0–10.5)

## 2013-07-24 MED ORDER — WARFARIN SODIUM 7.5 MG PO TABS
7.5000 mg | ORAL_TABLET | Freq: Once | ORAL | Status: AC
  Administered 2013-07-24: 7.5 mg via ORAL
  Filled 2013-07-24: qty 1

## 2013-07-24 NOTE — Discharge Instructions (Signed)
Ice to the knee at all times,  Do not prop anything under the knee  Rest with the knee in full extension.  Wear immobilizer to bed at night to maintain knee extension  Keep incision clean and dry for one week, then ok to get wet in the shower.  Do exercises every hour  Use CPM for 6 hours per day, in two hour increments  Follow up in two weeks.  [phone]  Make appointment with Alphonsa OverallBrad Dixon, PA-C  ____________________________________ Information on my medicine - Coumadin   (Warfarin)  This medication education was reviewed with me or my healthcare representative as part of my discharge preparation.  The pharmacist that spoke with me during my hospital stay was:  Lucia Gaskinsham, Rilyn Upshaw P, [facility] Psych[facility] InstituteRPH  Why was Coumadin prescribed for you? Coumadin was prescribed for you because you have a blood clot or a medical condition that can cause an increased risk of forming blood clots. Blood clots can cause serious health problems by blocking the flow of blood to the heart, lung, or brain. Coumadin can prevent harmful blood clots from forming. As a reminder your indication for Coumadin is:   Blood Clot Prevention After Orthopedic Surgery  What test will check on my response to Coumadin? While on Coumadin (warfarin) you will need to have an INR test regularly to ensure that your dose is keeping you in the desired range. The INR (international normalized ratio) number is calculated from the result of the laboratory test called prothrombin time (PT).  If an INR APPOINTMENT HAS NOT ALREADY BEEN MADE FOR YOU please schedule an appointment to have this lab work done by your health care provider within 7 days. Your INR goal is usually a number between:  2 to 3 or your provider may give you a more narrow range like 2-2.5.  Ask your health care provider during an office visit what your goal INR is.  What  do you need to  know  About  COUMADIN? Take Coumadin (warfarin) exactly as prescribed by your healthcare provider about the  same time each day.  DO NOT stop taking without talking to the doctor who prescribed the medication.  Stopping without other blood clot prevention medication to take the place of Coumadin may increase your risk of developing a new clot or stroke.  Get refills before you run out.  What do you do if you miss a dose? If you miss a dose, take it as soon as you remember on the same day then continue your regularly scheduled regimen the next day.  Do not take two doses of Coumadin at the same time.  Important Safety Information A possible side effect of Coumadin (Warfarin) is an increased risk of bleeding. You should call your healthcare provider right away if you experience any of the following:   Bleeding from an injury or your nose that does not stop.   Unusual colored urine (red or dark brown) or unusual colored stools (red or black).   Unusual bruising for unknown reasons.   A serious fall or if you hit your head (even if there is no bleeding).  Some foods or medicines interact with Coumadin (warfarin) and might alter your response to warfarin. To help avoid this:   Eat a balanced diet, maintaining a consistent amount of Vitamin K.   Notify your provider about major diet changes you plan to make.   Avoid alcohol or limit your intake to 1 drink for women and 2 drinks for men per  day. (1 drink is 5 oz. wine, 12 oz. beer, or 1.5 oz. liquor.)  Make sure that ANY health care provider who prescribes medication for you knows that you are taking Coumadin (warfarin).  Also make sure the healthcare provider who is monitoring your Coumadin knows when you have started a new medication including herbals and non-prescription products.  Coumadin (Warfarin)  Major Drug Interactions  Increased Warfarin Effect Decreased Warfarin Effect  Alcohol (large quantities) Antibiotics (esp. Septra/Bactrim, Flagyl, Cipro) Amiodarone (Cordarone) Aspirin (ASA) Cimetidine (Tagamet) Megestrol (Megace) NSAIDs (ibuprofen,  naproxen, etc.) Piroxicam (Feldene) Propafenone (Rythmol SR) Propranolol (Inderal) Isoniazid (INH) Posaconazole (Noxafil) Barbiturates (Phenobarbital) Carbamazepine (Tegretol) Chlordiazepoxide (Librium) Cholestyramine (Questran) Griseofulvin Oral Contraceptives Rifampin Sucralfate (Carafate) Vitamin K   Coumadin (Warfarin) Major Herbal Interactions  Increased Warfarin Effect Decreased Warfarin Effect  Garlic Ginseng Ginkgo biloba Coenzyme Q10 Green tea St. Johns wort    Coumadin (Warfarin) FOOD Interactions  Eat a consistent number of servings per week of foods HIGH in Vitamin K (1 serving =  cup)  Collards (cooked, or boiled & drained) Kale (cooked, or boiled & drained) Mustard greens (cooked, or boiled & drained) Parsley *serving size only =  cup Spinach (cooked, or boiled & drained) Swiss chard (cooked, or boiled & drained) Turnip greens (cooked, or boiled & drained)  Eat a consistent number of servings per week of foods MEDIUM-HIGH in Vitamin K (1 serving = 1 cup)  Asparagus (cooked, or boiled & drained) Broccoli (cooked, boiled & drained, or raw & chopped) Brussel sprouts (cooked, or boiled & drained) *serving size only =  cup Lettuce, raw (green leaf, endive, romaine) Spinach, raw Turnip greens, raw & chopped   These websites have more information on Coumadin (warfarin):  http://www.king-russell.com/www.coumadin.com; https://www.hines.net/www.ahrq.gov/consumer/coumadin.htm;

## 2013-07-24 NOTE — Progress Notes (Addendum)
PT Cancellation Note  Patient Details Name:  NationHerbert Both MRN: [id number] DOB: 10/29/1955   Cancelled Treatment:     Patient with severe pain and nausea this AM, Just returned to bed prior to therapy entrance, and working with nurse on pain/nausea.  Unable to see patient in AM.  Will re-attempt in PM.   Freida BusmanAllen, Ty L 07/24/2013, 12:06 PM

## 2013-07-24 NOTE — Progress Notes (Signed)
Physical Therapy Treatment Patient Details Name: Kyle Christensen Eagen MRN: [id number] DOB: 07/08/1955 Today's Date: 07/24/2013    History of Present Illness Admitted for elective R TKA    PT Comments    Patient with severe nausea, pain, and fever in AM.  Cleared by nursing for therapy.  Still significant discomfort, but patient agreeable to therapy, performed well, motivated to return home.  Follow Up Recommendations  Home health PT     Equipment Recommendations   No additional recs at this time, 3-in-1 commode in room today.   Recommendations for Other Services       Precautions / Restrictions Precautions Precautions: Knee Required Braces or Orthoses: Knee Immobilizer - Right Restrictions Weight Bearing Restrictions: Yes RLE Weight Bearing: Weight bearing as tolerated    Mobility  Bed Mobility Overal bed mobility: Needs Assistance Bed Mobility: Supine to Sit     Supine to sit: Min assist     General bed mobility comments: Assist for LE management only  Transfers Overall transfer level: Needs assistance Equipment used: Rolling walker (2 wheeled) Transfers: Sit to/from Stand Sit to Stand: Min guard            Ambulation/Gait Ambulation/Gait assistance: Min guard Ambulation Distance (Feet): 70 Feet Assistive device: Rolling walker (2 wheeled) Gait Pattern/deviations: Step-to pattern;Antalgic         Stairs Stairs:  (Not tested)              Balance Overall balance assessment: Needs assistance Sitting-balance support: No upper extremity supported Sitting balance-Leahy Scale: Good     Standing balance support: Bilateral upper extremity supported Standing balance-Leahy Scale: Fair Standing balance comment: with KI on                    Cognition Arousal/Alertness: Awake/alert Behavior During Therapy: WFL for tasks assessed/performed Overall Cognitive Status: Within Functional Limits for tasks assessed                       Exercises Total Joint Exercises Ankle Circles/Pumps: AROM;Both Quad Sets: AROM;AAROM;Right;10 reps Heel Slides: AAROM;Right;10 reps Goniometric ROM: 10-85* at EOB    General Comments        Pertinent Vitals/Pain Pain 8/10           PT Goals (current goals can now be found in the care plan section) Acute Rehab PT Goals Patient Stated Goal: home independent    Frequency  7X/week    PT Plan Current plan remains appropriate       End of Session   Activity Tolerance: Patient tolerated treatment well;Patient limited by fatigue;Patient limited by pain Patient left: in chair;with call bell/phone within reach;with family/visitor present     Time: 1540-1615 PT Time Calculation (min): 35 min  Charges:  $Gait Training: 8-22 mins $Therapeutic Exercise: 8-22 mins                    G Codes:      Allen, Ty L 07/24/2013, 4:33 PM

## 2013-07-24 NOTE — Progress Notes (Signed)
OT Cancellation Note  Patient Details Name: Kyle Christensen MRN: [id number] DOB: 05/22/1955   Cancelled Treatment:    Reason Eval/Treat Not Completed: Other (comment). Pt just in CPM, will attempt eval tomorrow.  Evette GeorgesLeonard, [name] [phone] 07/24/2013, 3:47 PM

## 2013-07-24 NOTE — Progress Notes (Signed)
Dr. Thomasena Edisollins aware of pt's temp being 101.4, no new orders at this time.

## 2013-07-24 NOTE — Progress Notes (Signed)
ANTICOAGULATION CONSULT NOTE - Follow Up Consult  Pharmacy Consult for coumadin Indication: VTE prophylaxis  Allergies  Allergen Reactions  . Percocet [Oxycodone-Acetaminophen] Other (See Comments)    Mouth ulcers    Patient Measurements: Height: 6' (182.9 cm) Weight: 242 lb (109.77 kg) IBW/kg (Calculated) : 77.6   Vital Signs: Temp: 99.2 F (37.3 C) (06/20 0543) BP: 116/67 mmHg (06/20 0543) Pulse Rate: 113 (06/20 0543)  Labs:  Recent Labs  07/23/13 0834 07/24/13 0507  HGB 15.9 13.8  HCT 44.2 39.3  PLT 197 240  APTT 32  --   LABPROT 12.9 13.6  INR 0.99 1.06  CREATININE 1.16 1.14    Estimated Creatinine Clearance: 92.6 ml/min (by C-G formula based on Cr of 1.14).  Assessment: Patient is a 58 y.o M on coumadin for VTE prophylaxis s/p right TKA.  INR is 1.06 after one dose of 7.5mg  given last night.  No bleeding documented.  Goal of Therapy:  INR 2-3    Plan:  1) coumadin 7.5mg  PO x1 today   Pham, Anh P 07/24/2013,11:10 AM

## 2013-07-24 NOTE — Progress Notes (Signed)
  Orthopedics Progress Note  Subjective: I feel good this AM  Objective:  Filed Vitals:   07/24/13 0543  BP: 116/67  Pulse: 113  Temp: 99.2 F (37.3 C)  Resp: 16    General: Awake and alert  Musculoskeletal: right knee extended with great Quad sets for me, ankle pumps without pain Neurovascularly intact  Lab Results  Component Value Date   WBC 13.4* 07/24/2013   HGB 13.8 07/24/2013   HCT 39.3 07/24/2013   MCV 95.9 07/24/2013   PLT 240 07/24/2013       Component Value Date/Time   NA 138 07/24/2013 0507   K 4.5 07/24/2013 0507   CL 99 07/24/2013 0507   CO2 29 07/24/2013 0507   GLUCOSE 130* 07/24/2013 0507   BUN 15 07/24/2013 0507   CREATININE 1.14 07/24/2013 0507   CALCIUM 9.0 07/24/2013 0507   GFRNONAA 70* 07/24/2013 0507   GFRAA 81* 07/24/2013 0507    Lab Results  Component Value Date   INR 1.06 07/24/2013   INR 0.99 07/23/2013    Assessment/Plan: POD #1 s/p Procedure(s): RIGHT TOTAL KNEE ARTHROPLASTY Looks great this AM OOB, mobilization WBAT on left, quad sets, dangling, gait training Patient would like to go home tomorrow if possible CPM 6 hours per day in two hour increments Home CPM please DVT prophylaxis - coumadin, mechanical  Almedia BallsSteven R. Ranell PatrickNorris, MD 07/24/2013 7:25 AM

## 2013-07-25 LAB — CBC
HCT: 38.3 % — ABNORMAL LOW (ref 39.0–52.0)
Hemoglobin: 13.2 g/dL (ref 13.0–17.0)
MCH: 33.2 pg (ref 26.0–34.0)
MCHC: 34.5 g/dL (ref 30.0–36.0)
MCV: 96.5 fL (ref 78.0–100.0)
Platelets: 213 10*3/uL (ref 150–400)
RBC: 3.97 MIL/uL — ABNORMAL LOW (ref 4.22–5.81)
RDW: 12.4 % (ref 11.5–15.5)
WBC: 12.6 10*3/uL — ABNORMAL HIGH (ref 4.0–10.5)

## 2013-07-25 LAB — PROTIME-INR
INR: 1.13 (ref 0.00–1.49)
PROTHROMBIN TIME: 14.3 s (ref 11.6–15.2)

## 2013-07-25 MED ORDER — WARFARIN SODIUM 10 MG PO TABS
10.0000 mg | ORAL_TABLET | Freq: Once | ORAL | Status: AC
  Administered 2013-07-25: 10 mg via ORAL
  Filled 2013-07-25: qty 1

## 2013-07-25 NOTE — Progress Notes (Signed)
    Subjective: 2 Days Post-Op Procedure(s) (LRB): RIGHT TOTAL KNEE ARTHROPLASTY (Right) Patient reports pain as 6 on 0-10 scale.   Denies CP or SOB.  Voiding without difficulty. Positive flatus. Objective: Vital signs in last 24 hours: Temp:  [99.4 F (37.4 C)-102.8 F (39.3 C)] 101.7 F (38.7 C) (06/21 0604) Pulse Rate:  [111-127] 127 (06/21 0604) Resp:  [16-20] 18 (06/21 0604) BP: (119-142)/(69-93) 142/93 mmHg (06/20 1900) SpO2:  [92 %-96 %] 93 % (06/21 0604)  Intake/Output from previous day: 06/20 0701 - 06/21 0700 In: 1003.1 [P.O.:720; I.V.:283.1] Out: 1540 [Urine:1540] Intake/Output this shift:    Labs:  Recent Labs  07/23/13 0834 07/24/13 0507 07/25/13 0425  HGB 15.9 13.8 13.2    Recent Labs  07/24/13 0507 07/25/13 0425  WBC 13.4* 12.6*  RBC 4.10* 3.97*  HCT 39.3 38.3*  PLT 240 213    Recent Labs  07/23/13 0834 07/24/13 0507  NA 138 138  K 3.6* 4.5  CL 99 99  CO2 27 29  BUN 16 15  CREATININE 1.16 1.14  GLUCOSE 116* 130*  CALCIUM 9.6 9.0    Recent Labs  07/24/13 0507 07/25/13 0425  INR 1.06 1.13    Physical Exam: Neurologically intact ABD soft Neurovascular intact Intact pulses distally Incision: dressing C/Christensen/I Compartment soft  Assessment/Plan: 2 Days Post-Op Procedure(s) (LRB): RIGHT TOTAL KNEE ARTHROPLASTY (Right) Advance diet Up with therapy Mobilization with therapy Continue per protocal  BROOKS,Kyle Christensen for Dr. Venita Lickahari Brooks [facility] [phone] 07/25/2013, 7:49 AM

## 2013-07-25 NOTE — Progress Notes (Signed)
Physical Therapy Treatment Patient Details Name: Kyle Christensen MRN: [id number] DOB: 06/05/1955 Today's Date: 07/25/2013    History of Present Illness Admitted for elective R TKA    PT Comments    Patient making progress with mobility and gait.    Follow Up Recommendations  Home health PT;Supervision/Assistance - 24 hour     Equipment Recommendations  3in1 (PT) (Patient reports he has a RW at home)    Recommendations for Other Services       Precautions / Restrictions Precautions Precautions: Knee Precaution Booklet Issued: Yes (comment) Precaution Comments: Provided education on precautions to patient. Required Braces or Orthoses: Knee Immobilizer - Right Restrictions Weight Bearing Restrictions: Yes RLE Weight Bearing: Weight bearing as tolerated    Mobility  Bed Mobility Overal bed mobility: Needs Assistance Bed Mobility: Sit to Supine       Sit to supine: Min assist   General bed mobility comments: Assist to bring RLE onto bed to return to supine.  Transfers Overall transfer level: Needs assistance Equipment used: Rolling walker (2 wheeled) Transfers: Sit to/from Stand Sit to Stand: Min guard         General transfer comment: Cues for hand placement and safety.  Ambulation/Gait Ambulation/Gait assistance: Min guard Ambulation Distance (Feet): 160 Feet Assistive device: Rolling walker (2 wheeled) Gait Pattern/deviations: Step-to pattern;Decreased stance time - right;Decreased step length - left;Antalgic Gait velocity: Decreased Gait velocity interpretation: Below normal speed for age/gender General Gait Details: Verbal cues for safety.  Emphasized no twisting/pivoting on RLE during turns.   Stairs            Wheelchair Mobility    Modified Rankin (Stroke Patients Only)       Balance                                    Cognition Arousal/Alertness: Awake/alert Behavior During Therapy: WFL for tasks  assessed/performed Overall Cognitive Status: Within Functional Limits for tasks assessed                      Exercises Total Joint Exercises Ankle Circles/Pumps: AROM;Both;10 reps;Supine Quad Sets: AROM;Right;10 reps;Supine Short Arc Quad: AROM;Right;10 reps;Supine Heel Slides: AAROM;Right;10 reps;Supine Hip ABduction/ADduction: AAROM;Right;10 reps;Supine Knee Flexion: AROM;AAROM;Right;10 reps;Seated Goniometric ROM: -15* to 75*    General Comments        Pertinent Vitals/Pain     Home Living                      Prior Function            PT Goals (current goals can now be found in the care plan section) Progress towards PT goals: Progressing toward goals    Frequency  7X/week    PT Plan Current plan remains appropriate    Co-evaluation             End of Session Equipment Utilized During Treatment: Gait belt;Right knee immobilizer Activity Tolerance: Patient tolerated treatment well Patient left: in bed;with call bell/phone within reach;with family/visitor present     Time: 1451-1516 PT Time Calculation (min): 25 min  Charges:  $Gait Training: 8-22 mins $Therapeutic Exercise: 8-22 mins                    G Codes:      Vena AustriaDavis, Lavonte Palos H 07/25/2013, 5:11 PM Durenda HurtSusan H. Renaldo Fiddleravis, PT, [facility] Pager [phone]

## 2013-07-25 NOTE — Progress Notes (Signed)
Patient forced fluids, TCBD, Incentive Spirometer max 1250 with adequate technique. Tylenol given po, awaiting recheck of temp

## 2013-07-25 NOTE — Progress Notes (Signed)
Dr Shon BatonBrooks at bedside, surgical dressing removed. Suture line well approximated, no s/s redness or drainage. Steri strips intact. 4x4's and cover roll tape for outer dressing. Patient encouraged to use IS , TCDB, hourly and use Robaxin to decrease amounts of narcotic

## 2013-07-25 NOTE — Progress Notes (Signed)
Physical Therapy Treatment Patient Details Name: Kyle Christensen: [id number] DOB: 02/22/1955 Today's Date: 07/25/2013    History of Present Illness Admitted for elective R TKA    PT Comments    Patient making gains toward goals.  Follow Up Recommendations  Home health PT;Supervision/Assistance - 24 hour     Equipment Recommendations  3in1 (PT)    Recommendations for Other Services       Precautions / Restrictions Precautions Precautions: Knee Required Braces or Orthoses: Knee Immobilizer - Right Restrictions Weight Bearing Restrictions: Yes RLE Weight Bearing: Weight bearing as tolerated    Mobility  Bed Mobility Overal bed mobility: Needs Assistance Bed Mobility: Supine to Sit     Supine to sit: Min guard     General bed mobility comments: Instructed patient on donning KI on RLE.  No physical assist needed to move to sitting - assist for safety only.  Transfers Overall transfer level: Needs assistance Equipment used: Rolling walker (2 wheeled) Transfers: Sit to/from Stand Sit to Stand: Min guard         General transfer comment: Cues for hand placement and safety.  Ambulation/Gait Ambulation/Gait assistance: Min guard Ambulation Distance (Feet): 120 Feet Assistive device: Rolling walker (2 wheeled) Gait Pattern/deviations: Step-to pattern;Decreased stance time - right;Decreased step length - left;Decreased stride length;Antalgic Gait velocity: More safe pace today Gait velocity interpretation: Below normal speed for age/gender General Gait Details: Verbal cues for safety and gait sequence.   Stairs            Wheelchair Mobility    Modified Rankin (Stroke Patients Only)       Balance                                    Cognition Arousal/Alertness: Awake/alert Behavior During Therapy: WFL for tasks assessed/performed Overall Cognitive Status: Within Functional Limits for tasks assessed                       Exercises      General Comments        Pertinent Vitals/Pain     Home Living                      Prior Function            PT Goals (current goals can now be found in the care plan section) Progress towards PT goals: Progressing toward goals    Frequency  7X/week    PT Plan Current plan remains appropriate    Co-evaluation             End of Session Equipment Utilized During Treatment: Gait belt;Right knee immobilizer Activity Tolerance: Patient tolerated treatment well Patient left: in chair;with nursing/sitter in room (3-in-1 in bathroom for bath at sink)     Time: [id number] PT Time Calculation (min): 15 min  Charges:  $Gait Training: 8-22 mins                    G Codes:      Vena AustriaDavis, Susan H 07/25/2013, 9:15 AM Durenda HurtSusan H. Renaldo Fiddleravis, PT, [facility] Pager [phone]

## 2013-07-25 NOTE — Evaluation (Addendum)
Occupational Therapy Evaluation Patient Details Name: Kyle Christensen MRN: [id number] DOB: 09/11/1955 Today's Date: 07/25/2013    History of Present Illness Admitted for elective R TKA   Clinical Impression   Pt s/p above. Pt independent with ADLs, PTA. Feel pt will benefit from acute OT to increase independence prior to d/c. Plan to practice shower transfer next session.   Follow Up Recommendations  No OT follow up;Supervision - Intermittent    Equipment Recommendations  Tub/shower seat    Recommendations for Other Services       Precautions / Restrictions Precautions Precautions: Knee Precaution Comments: educated no twisting knee Required Braces or Orthoses: Knee Immobilizer - Right Restrictions Weight Bearing Restrictions: Yes RLE Weight Bearing: Weight bearing as tolerated      Mobility Bed Mobility     General bed mobility comments: not assessed  Transfers Overall transfer level: Needs assistance Equipment used: Rolling walker (2 wheeled) Transfers: Sit to/from Stand Sit to Stand: Min guard            Balance                                             ADL Overall ADL's : Needs assistance/impaired     Grooming: Set up;Sitting               Lower Body Dressing: Moderate assistance;Sit to/from stand   Toilet Transfer: Min guard;Ambulation;BSC (chair and BSC)           Functional mobility during ADLs: Min guard;Rolling walker General ADL Comments: Educated on AE for LB ADLs-pt states wife will help him if needed (pt has reacher at home). Educated on safety tips for home (safe shoewear, rugs, sitting for LB ADLs). Educated on shower transfer technique and recommended someone being with him for this and for bathing. Educated on dressing technique. explained benefit of reaching down to don/doff right sock as it allows knee to bend. Spoke about knee immobilizer. Planning to sponge bathe for one week. Discussed use of bag on walker.      Vision                     Perception     Praxis      Pertinent Vitals/Pain Pain 4/10. Repositioned. Increased activity during session.     Hand Dominance     Extremity/Trunk Assessment Upper Extremity Assessment Upper Extremity Assessment: Overall WFL for tasks assessed   Lower Extremity Assessment Lower Extremity Assessment: Defer to PT evaluation       Communication Communication Communication: No difficulties   Cognition Arousal/Alertness: Awake/alert Behavior During Therapy: WFL for tasks assessed/performed Overall Cognitive Status: Within Functional Limits for tasks assessed                     General Comments       Exercises       Shoulder Instructions      Home Living Family/patient expects to be discharged to:: Private residence Living Arrangements: Spouse/significant other Available Help at Discharge: Family Type of Home: House Home Access: Stairs to enter Secretary/administratorntrance Stairs-Number of Steps: several Entrance Stairs-Rails: Right;Left Home Layout: One level     Bathroom Shower/Tub: Producer, television/film/videoWalk-in shower   Bathroom Toilet:  (has BSC)     Home Equipment: Adaptive equipment;Other (comment) (BSC) Adaptive Equipment: Reacher        Prior Functioning/Environment  Level of Independence: Independent             OT Diagnosis: Acute pain   OT Problem List: Decreased strength;Decreased range of motion;Decreased knowledge of use of DME or AE;Decreased knowledge of precautions;Pain   OT Treatment/Interventions: Self-care/ADL training;DME and/or AE instruction;Therapeutic activities;Patient/family education;Balance training    OT Goals(Current goals can be found in the care plan section) Acute Rehab OT Goals Patient Stated Goal: not stated OT Goal Formulation: With patient Time For Goal Achievement: 08/01/13 Potential to Achieve Goals: Good ADL Goals Pt Will Perform Lower Body Dressing: with set-up;sit to/from stand;with caregiver  independent in assisting;with supervision Pt Will Transfer to Toilet: with modified independence;ambulating (3 in 1 over commode) Pt Will Perform Tub/Shower Transfer: Shower transfer;with supervision;ambulating;shower seat;rolling walker  OT Frequency: Min 2X/week   Barriers to D/C:            Co-evaluation              End of Session Equipment Utilized During Treatment: Rolling walker;Right knee immobilizer CPM Right Knee CPM Right Knee: Off Nurse Communication: Mobility status  Activity Tolerance: Patient tolerated treatment well Patient left: in chair;with call bell/phone within reach   Time: [id number] OT Time Calculation (min): 23 min Charges:  OT General Charges $OT Visit: 1 Procedure OT Evaluation $Initial OT Evaluation Tier I: 1 Procedure OT Treatments $Self Care/Home Management : 8-22 mins G-CodesEarlie Christensen:    Kyle Christensen OTR/Christensen [phone] 07/25/2013, 9:57 AM

## 2013-07-25 NOTE — Progress Notes (Signed)
ANTICOAGULATION CONSULT NOTE - Follow Up Consult  Pharmacy Consult for coumadin Indication: VTE prophylaxis  Allergies  Allergen Reactions  . Percocet [Oxycodone-Acetaminophen] Other (See Comments)    Mouth ulcers    Patient Measurements: Height: 6' (182.9 cm) Weight: 242 lb (109.77 kg) IBW/kg (Calculated) : 77.6  Vital Signs: Temp: 98.8 F (37.1 C) (06/21 1039) Temp src: Oral (06/21 1039) BP: 131/80 mmHg (06/21 1039) Pulse Rate: 127 (06/21 0604)  Labs:  Recent Labs  07/23/13 0834 07/24/13 0507 07/25/13 0425  HGB 15.9 13.8 13.2  HCT 44.2 39.3 38.3*  PLT 197 240 213  APTT 32  --   --   LABPROT 12.9 13.6 14.3  INR 0.99 1.06 1.13  CREATININE 1.16 1.14  --     Estimated Creatinine Clearance: 92.6 ml/min (by C-G formula based on Cr of 1.14).  Assessment: Patient is a 58 y.o M on coumadin for VTE prophylaxis.  INR up slightly today at 1.13.  No bleeding documented.  Goal of Therapy:  INR 2-3    Plan:  1) coumadin 10mg  PO x1 today  Pham, Anh P 07/25/2013,11:40 AM

## 2013-07-26 ENCOUNTER — Encounter (HOSPITAL_COMMUNITY): Payer: Self-pay | Admitting: Orthopedic Surgery

## 2013-07-26 LAB — PROTIME-INR
INR: 1.27 (ref 0.00–1.49)
PROTHROMBIN TIME: 15.6 s — AB (ref 11.6–15.2)

## 2013-07-26 LAB — CBC
HCT: 36.1 % — ABNORMAL LOW (ref 39.0–52.0)
Hemoglobin: 12.5 g/dL — ABNORMAL LOW (ref 13.0–17.0)
MCH: 33.3 pg (ref 26.0–34.0)
MCHC: 34.6 g/dL (ref 30.0–36.0)
MCV: 96.3 fL (ref 78.0–100.0)
Platelets: 197 10*3/uL (ref 150–400)
RBC: 3.75 MIL/uL — AB (ref 4.22–5.81)
RDW: 12.1 % (ref 11.5–15.5)
WBC: 12 10*3/uL — ABNORMAL HIGH (ref 4.0–10.5)

## 2013-07-26 NOTE — Care Management Note (Signed)
CARE MANAGEMENT NOTE 07/26/2013  Patient:  Kyle Christensen,Kyle Christensen   Account Number:  [id number]  Date Initiated:  07/26/2013  Documentation initiated by:  Vance PeperBRADY,SUSAN  Subjective/Objective Assessment:   58 yr old male s/p right total knee arthroplasty.     Action/Plan:   Case manager spoke with patient concerning home health and DME needs at discharge. [facility] no changes. Has famiyl support at discharge.   Anticipated DC Date:  07/26/2013   Anticipated DC Plan:  HOME W HOME HEALTH SERVICES      DC Planning Services  CM consult      [facility]  DURABLE MEDICAL EQUIPMENT   Choice offered to / List presented to:  C-1 Patient   DME arranged  CPM  WALKER - ROLLING  3-N-1  SHOWER STOOL      DME agency  [employer]     HH arranged  HH-2 PT  HH-1 RN      [facility]   Status of service:  Completed, signed off Medicare Important Message given?   (If response is "NO", the following Medicare IM given date fields will be blank) Date Medicare IM given:   Date Additional Medicare IM given:    Discharge Disposition:  HOME W HOME HEALTH SERVICES  Per UR Regulation:  Reviewed for med. necessity/level of care/duration of stay

## 2013-07-26 NOTE — Progress Notes (Signed)
I have read and agree with this note.   Time in/out:1116-1138 Total time:(22 minutes--1SC)  Ignacia Palmaathy Aubri Gathright, OTR/L [phone]

## 2013-07-26 NOTE — Progress Notes (Signed)
Orthopedics Progress Note  Subjective: patietn feeling better this morning. Ready to go home.  Objective:  Filed Vitals:   07/26/13 0629  BP: 124/82  Pulse: 102  Temp: 98.5 F (36.9 C)  Resp: 16    General: Awake and alert  Musculoskeletal: right knee wound CDI, mepilex in place Neurovascularly intact  Lab Results  Component Value Date   WBC 12.0* 07/26/2013   HGB 12.5* 07/26/2013   HCT 36.1* 07/26/2013   MCV 96.3 07/26/2013   PLT 197 07/26/2013       Component Value Date/Time   NA 138 07/24/2013 0507   K 4.5 07/24/2013 0507   CL 99 07/24/2013 0507   CO2 29 07/24/2013 0507   GLUCOSE 130* 07/24/2013 0507   BUN 15 07/24/2013 0507   CREATININE 1.14 07/24/2013 0507   CALCIUM 9.0 07/24/2013 0507   GFRNONAA 70* 07/24/2013 0507   GFRAA 81* 07/24/2013 0507    Lab Results  Component Value Date   INR 1.27 07/26/2013   INR 1.13 07/25/2013   INR 1.06 07/24/2013    Assessment/Plan: POD #3 s/p Procedure(s): RIGHT TOTAL KNEE ARTHROPLASTY D/C to home after cleared by PT Rx on chart  Steven R. Ranell PatrickNorris, MD 07/26/2013 7:46 AM

## 2013-07-26 NOTE — Progress Notes (Signed)
Physical Therapy Treatment Patient Details Name: Kyle Christensen MRN: [id number] DOB: 11/28/1955 Today's Date: 07/26/2013    History of Present Illness Admitted for elective R TKA    PT Comments    Patient is progressing well towards physical therapy goals, ambulating up to 125 feet with supervision while using rolling walker. He reports that he will not have any stairs to climb when he returns home and states he feels confident with all mobility tasks. All education has been reviewed and questions have been answered. Pt is adequate for d/c from PT standpoint. Patient will continue to benefit from skilled physical therapy services at home with HHPT to further improve independence with functional mobility.    Follow Up Recommendations  Home health PT;Supervision/Assistance - 24 hour     Equipment Recommendations  3in1 (PT) (Patient reports he has a RW at home)    Recommendations for Other Services       Precautions / Restrictions Precautions Precautions: Knee Required Braces or Orthoses: Knee Immobilizer - Right Restrictions Weight Bearing Restrictions: Yes RLE Weight Bearing: Weight bearing as tolerated    Mobility  Bed Mobility                  Transfers Overall transfer level: Needs assistance Equipment used: Rolling walker (2 wheeled) Transfers: Sit to/from Stand Sit to Stand: Supervision         General transfer comment: Supervision for safety. Correctly demonstrates safe hand placement. Requires extra time.  Ambulation/Gait Ambulation/Gait assistance: Supervision Ambulation Distance (Feet): 125 Feet Assistive device: Rolling walker (2 wheeled) Gait Pattern/deviations: Step-to pattern;Step-through pattern;Decreased stance time - right;Decreased step length - left;Antalgic Gait velocity: Decreased Gait velocity interpretation: Below normal speed for age/gender General Gait Details: Supervision for safety, VCs for upright posture and forward gaze. Good  control of RW. Educated on right knee extension in stance phase for quad activation. Mild instability without Knee immobilizer in place but no instance of buckling.   Stairs            Wheelchair Mobility    Modified Rankin (Stroke Patients Only)       Balance                                    Cognition Arousal/Alertness: Awake/alert Behavior During Therapy: WFL for tasks assessed/performed Overall Cognitive Status: Within Functional Limits for tasks assessed                      Exercises Total Joint Exercises Ankle Circles/Pumps: AROM;Both;10 reps;Supine    General Comments General comments (skin integrity, edema, etc.): Pt reports feeling confident with his mobility and states he is ready to go home      Pertinent Vitals/Pain 4/10 pain Patient repositioned in chair for comfort.     Home Living                      Prior Function            PT Goals (current goals can now be found in the care plan section) Progress towards PT goals: Progressing toward goals    Frequency  7X/week    PT Plan Current plan remains appropriate    Co-evaluation             End of Session   Activity Tolerance: Patient tolerated treatment well Patient left: with call bell/phone within reach;in chair  Time: 1610-96040846-0900 PT Time Calculation (min): 14 min  Charges:  $Gait Training: 8-22 mins                    G Codes:      Charlsie MerlesLogan Secor Barbour, [place] CarolinaPT [phone]  Berton MountBarbour, Logan S 07/26/2013, 9:46 AM

## 2013-07-26 NOTE — Discharge Summary (Signed)
Physician Discharge Summary   Patient ID: Kyle Christensen MRN: [id number] DOB/AGE: 57/08/1955 57 y.o.  Admit date: 07/23/2013 Discharge date: 07/26/2013  Admission Diagnoses:  Active Problems:   Arthritis of knee, degenerative   Discharge Diagnoses:  Same   Surgeries: Procedure(s): RIGHT TOTAL KNEE ARTHROPLASTY on 07/23/2013   Consultants: PT/OT  Discharged Condition: Stable  Hospital Course: Kyle NationHerbert Christensen is an 58 y.o. male who was admitted 07/23/2013 with a chief complaint of No chief complaint on file. , and found to have a diagnosis of <principal problem not specified>.  They were brought to the operating room on 07/23/2013 and underwent the above named procedures.    The patient had an uncomplicated hospital course and was stable for discharge.  Recent vital signs:  Filed Vitals:   07/26/13 0748  BP: 132/80  Pulse: 100  Temp: 99 F (37.2 C)  Resp: 16    Recent laboratory studies:  Results for orders placed during the hospital encounter of 07/23/13  APTT      Result Value Ref Range   aPTT 32  24 - 37 seconds  BASIC METABOLIC PANEL      Result Value Ref Range   Sodium 138  137 - 147 mEq/L   Potassium 3.6 (*) 3.7 - 5.3 mEq/L   Chloride 99  96 - 112 mEq/L   CO2 27  19 - 32 mEq/L   Glucose, Bld 116 (*) 70 - 99 mg/dL   BUN 16  6 - 23 mg/dL   Creatinine, Ser 7.821.16  0.50 - 1.35 mg/dL   Calcium 9.6  8.4 - 95.610.5 mg/dL   GFR calc non Af Amer 69 (*) >90 mL/min   GFR calc Af Amer 80 (*) >90 mL/min  CBC      Result Value Ref Range   WBC 7.1  4.0 - 10.5 K/uL   RBC 4.67  4.22 - 5.81 MIL/uL   Hemoglobin 15.9  13.0 - 17.0 g/dL   HCT 21.344.2  08.639.0 - 57.852.0 %   MCV 94.6  78.0 - 100.0 fL   MCH 34.0  26.0 - 34.0 pg   MCHC 36.0  30.0 - 36.0 g/dL   RDW 46.912.1  62.911.5 - 52.815.5 %   Platelets 197  150 - 400 K/uL  PROTIME-INR      Result Value Ref Range   Prothrombin Time 12.9  11.6 - 15.2 seconds   INR 0.99  0.00 - 1.49  PROTIME-INR      Result Value Ref Range   Prothrombin Time 13.6   11.6 - 15.2 seconds   INR 1.06  0.00 - 1.49  CBC      Result Value Ref Range   WBC 13.4 (*) 4.0 - 10.5 K/uL   RBC 4.10 (*) 4.22 - 5.81 MIL/uL   Hemoglobin 13.8  13.0 - 17.0 g/dL   HCT 41.339.3  24.439.0 - 01.052.0 %   MCV 95.9  78.0 - 100.0 fL   MCH 33.7  26.0 - 34.0 pg   MCHC 35.1  30.0 - 36.0 g/dL   RDW 27.212.1  53.611.5 - 64.415.5 %   Platelets 240  150 - 400 K/uL  BASIC METABOLIC PANEL      Result Value Ref Range   Sodium 138  137 - 147 mEq/L   Potassium 4.5  3.7 - 5.3 mEq/L   Chloride 99  96 - 112 mEq/L   CO2 29  19 - 32 mEq/L   Glucose, Bld 130 (*) 70 - 99 mg/dL  BUN 15  6 - 23 mg/dL   Creatinine, Ser 9.56  0.50 - 1.35 mg/dL   Calcium 9.0  8.4 - 21.3 mg/dL   GFR calc non Af Amer 70 (*) >90 mL/min   GFR calc Af Amer 81 (*) >90 mL/min  PROTIME-INR      Result Value Ref Range   Prothrombin Time 14.3  11.6 - 15.2 seconds   INR 1.13  0.00 - 1.49  CBC      Result Value Ref Range   WBC 12.6 (*) 4.0 - 10.5 K/uL   RBC 3.97 (*) 4.22 - 5.81 MIL/uL   Hemoglobin 13.2  13.0 - 17.0 g/dL   HCT 08.6 (*) 57.8 - 46.9 %   MCV 96.5  78.0 - 100.0 fL   MCH 33.2  26.0 - 34.0 pg   MCHC 34.5  30.0 - 36.0 g/dL   RDW 62.9  52.8 - 41.3 %   Platelets 213  150 - 400 K/uL  PROTIME-INR      Result Value Ref Range   Prothrombin Time 15.6 (*) 11.6 - 15.2 seconds   INR 1.27  0.00 - 1.49  CBC      Result Value Ref Range   WBC 12.0 (*) 4.0 - 10.5 K/uL   RBC 3.75 (*) 4.22 - 5.81 MIL/uL   Hemoglobin 12.5 (*) 13.0 - 17.0 g/dL   HCT 24.4 (*) 01.0 - 27.2 %   MCV 96.3  78.0 - 100.0 fL   MCH 33.3  26.0 - 34.0 pg   MCHC 34.6  30.0 - 36.0 g/dL   RDW 53.6  64.4 - 03.4 %   Platelets 197  150 - 400 K/uL  TYPE AND SCREEN      Result Value Ref Range   ABO/RH(D) O POS     Antibody Screen NEG     Sample Expiration 07/26/2013    ABO/RH      Result Value Ref Range   ABO/RH(D) O POS      Discharge Medications:     Medication List         ALPRAZolam 0.5 MG tablet  Commonly known as:  XANAX  Take 1 mg by mouth at  bedtime.     aspirin 81 MG tablet  Take 81 mg by mouth daily.     hydrochlorothiazide 25 MG tablet  Commonly known as:  HYDRODIURIL  Take 25 mg by mouth daily.     HYDROmorphone 2 MG tablet  Commonly known as:  DILAUDID  Take 1 tablet (2 mg total) by mouth every 4 (four) hours as needed for severe pain.     meloxicam 7.5 MG tablet  Commonly known as:  MOBIC  Take 15 mg by mouth daily.     methocarbamol 500 MG tablet  Commonly known as:  ROBAXIN  Take 1 tablet (500 mg total) by mouth 3 (three) times daily as needed for muscle spasms.     potassium chloride 10 MEQ tablet  Commonly known as:  K-DUR  Take 10 mEq by mouth daily.     tamsulosin 0.4 MG Caps capsule  Commonly known as:  FLOMAX  Take 0.4 mg by mouth daily after supper.     warfarin 5 MG tablet  Commonly known as:  COUMADIN  Take 1 tablet (5 mg total) by mouth daily.        Diagnostic Studies: Dg Chest 2 View  07/13/2013   CLINICAL DATA:  Preoperative evaluation for RIGHT knee replacement, history hypertension  EXAM:  CHEST  2 VIEW  COMPARISON:  04/07/2013  FINDINGS: Upper normal heart size.  Normal mediastinal contours and pulmonary vascularity.  Eventration RIGHT diaphragm stable.  Linear scar at anterior lungs on lateral view is stable.  Lungs otherwise clear.  No acute infiltrate, pleural effusion or pneumothorax.  Small radiopaque foreign body 6 mm diameter at the anterior LEFT parasternal soft tissues.  IMPRESSION: No acute abnormalities.   Electronically Signed   By: Ulyses SouthwardMark  Boles M.D.   On: 07/13/2013 15:48   Dg Knee Right Port  07/23/2013   CLINICAL DATA:  Status post total knee replacement.  EXAM: PORTABLE RIGHT KNEE - 1-2 VIEW  COMPARISON:  None.  FINDINGS: Two-view portable exam of the right knee shows the patient to be status post tricompartmental knee replacement. No evidence for immediate hardware complications. Gas within the overlying soft tissues is compatible with the immediate postoperative state.   IMPRESSION: No evidence for immediate hardware complications.   Electronically Signed   By: Kennith CenterEric  Mansell M.D.   On: 07/23/2013 13:36    Disposition: Final discharge disposition not confirmed        Follow-up Information   Follow up with NORRIS,STEVEN R, MD. Call in 2 weeks. [phone])    Specialty:  Orthopedic Surgery   Contact information:   [address] [place] [address] [phone]       [facility]. (Someone from [facility] will contact you concerning start date and time for physical therapy.)    Contact information:   [address] [place] [address] [phone]        Signed: Thea GistDIXON,THOMAS B 07/26/2013, 10:14 AM

## 2013-07-26 NOTE — Progress Notes (Signed)
Occupational Therapy Treatment and Discharge Patient Details Name: Kyle Christensen MRN: [id number] DOB: 20-Oct-1955 Today's Date: 07/26/2013    History of present illness Admitted for elective R TKA on 07/23/13   OT comments  Focus of today's tx was on practicing a shower transfer. Pt educated on and practiced proper shower transfer technique. Pt stated that he has a large shower is able to walk into it and turn around to sit on a shower chair using his rolling walker. Reviewed LB dressing sequence with pt and he stated that his wife will be available 24 hours/day to help. Goals were met and education was completed, pt has no further OT needs, we will sign off.  Follow Up Recommendations  No OT follow up;Supervision - Intermittent          Precautions / Restrictions Precautions Precautions: Knee Required Braces or Orthoses: Knee Immobilizer - Right Knee Immobilizer - Right: On when out of bed or walking;Other (comment) (when OOB and while sleeping) Restrictions RLE Weight Bearing: Weight bearing as tolerated       Mobility Bed Mobility               General bed mobility comments: Pt up in chair upon OT tx.  Transfers Overall transfer level: Needs assistance Equipment used: Rolling walker (2 wheeled) Transfers: Sit to/from Stand Sit to Stand: Supervision         General transfer comment: Supervision for safety. Correctly demonstrates safe hand placement. Requires extra time.    Balance Overall balance assessment: Needs assistance Sitting-balance support: No upper extremity supported Sitting balance-Leahy Scale: Good     Standing balance support: Bilateral upper extremity supported Standing balance-Leahy Scale: Fair                     ADL Overall ADL's : Needs assistance/impaired                                 Tub/ Shower Transfer: Walk-in shower;Supervision/safety;Ambulation;Rolling walker;Shower seat;Grab bars     General ADL Comments:  Practiced shower transfer with pt. Pt has a large walk-in shower with built in shower chair, grab bars, handheld shower head, long handled sponge, and is getting a shower chair so he can move it around in the shower. Reviewed LB bathing/dressing sequence and technique and pt will have his wife assist him with those tasks. Spoke about wearing schedule for knee immobilizer.                Cognition   Behavior During Therapy: WFL for tasks assessed/performed Overall Cognitive Status: Within Functional Limits for tasks assessed                                    Pertinent Vitals/ Pain       No c/o pain.         Frequency Min 2X/week     Progress Toward Goals  OT Goals(current goals can now be found in the care plan section)  Progress towards OT goals: Goals met/education completed, patient discharged from OT  Acute Rehab OT Goals Patient Stated Goal: to go home OT Goal Formulation: With patient Time For Goal Achievement: 08/01/13 Potential to Achieve Goals: Good  Plan Discharge plan remains appropriate       End of Session Equipment Utilized During Treatment: Rolling walker;Right knee immobilizer  Activity Tolerance Patient tolerated treatment well   Patient Left in chair;with call bell/phone within reach           Time: 7353-2992 OT Time Calculation (min): 22 min  Charges: OT General Charges $OT Visit: 1 Procedure OT Treatments $Self Care/Home Management : 8-22 mins  Lyda Perone 07/26/2013, 3:01 PM

## 2013-11-01 ENCOUNTER — Other Ambulatory Visit (HOSPITAL_COMMUNITY): Payer: BC Managed Care – PPO

## 2013-12-10 ENCOUNTER — Encounter (HOSPITAL_COMMUNITY): Admission: RE | Payer: Self-pay | Source: Ambulatory Visit

## 2013-12-10 ENCOUNTER — Inpatient Hospital Stay (HOSPITAL_COMMUNITY)
Admission: RE | Admit: 2013-12-10 | Payer: BC Managed Care – PPO | Source: Ambulatory Visit | Admitting: Orthopedic Surgery

## 2013-12-10 SURGERY — ARTHROPLASTY, KNEE, TOTAL
Anesthesia: General | Site: Knee | Laterality: Left

## 2016-05-29 IMAGING — CR DG KNEE 1-2V PORT*R*
2 series · 2 of 2 positions shown · non-contrast
Comparison: None.

CLINICAL DATA: Status post total knee replacement.

EXAM:
PORTABLE RIGHT KNEE - 1-2 VIEW

[xtable lateral (1 of 2)]
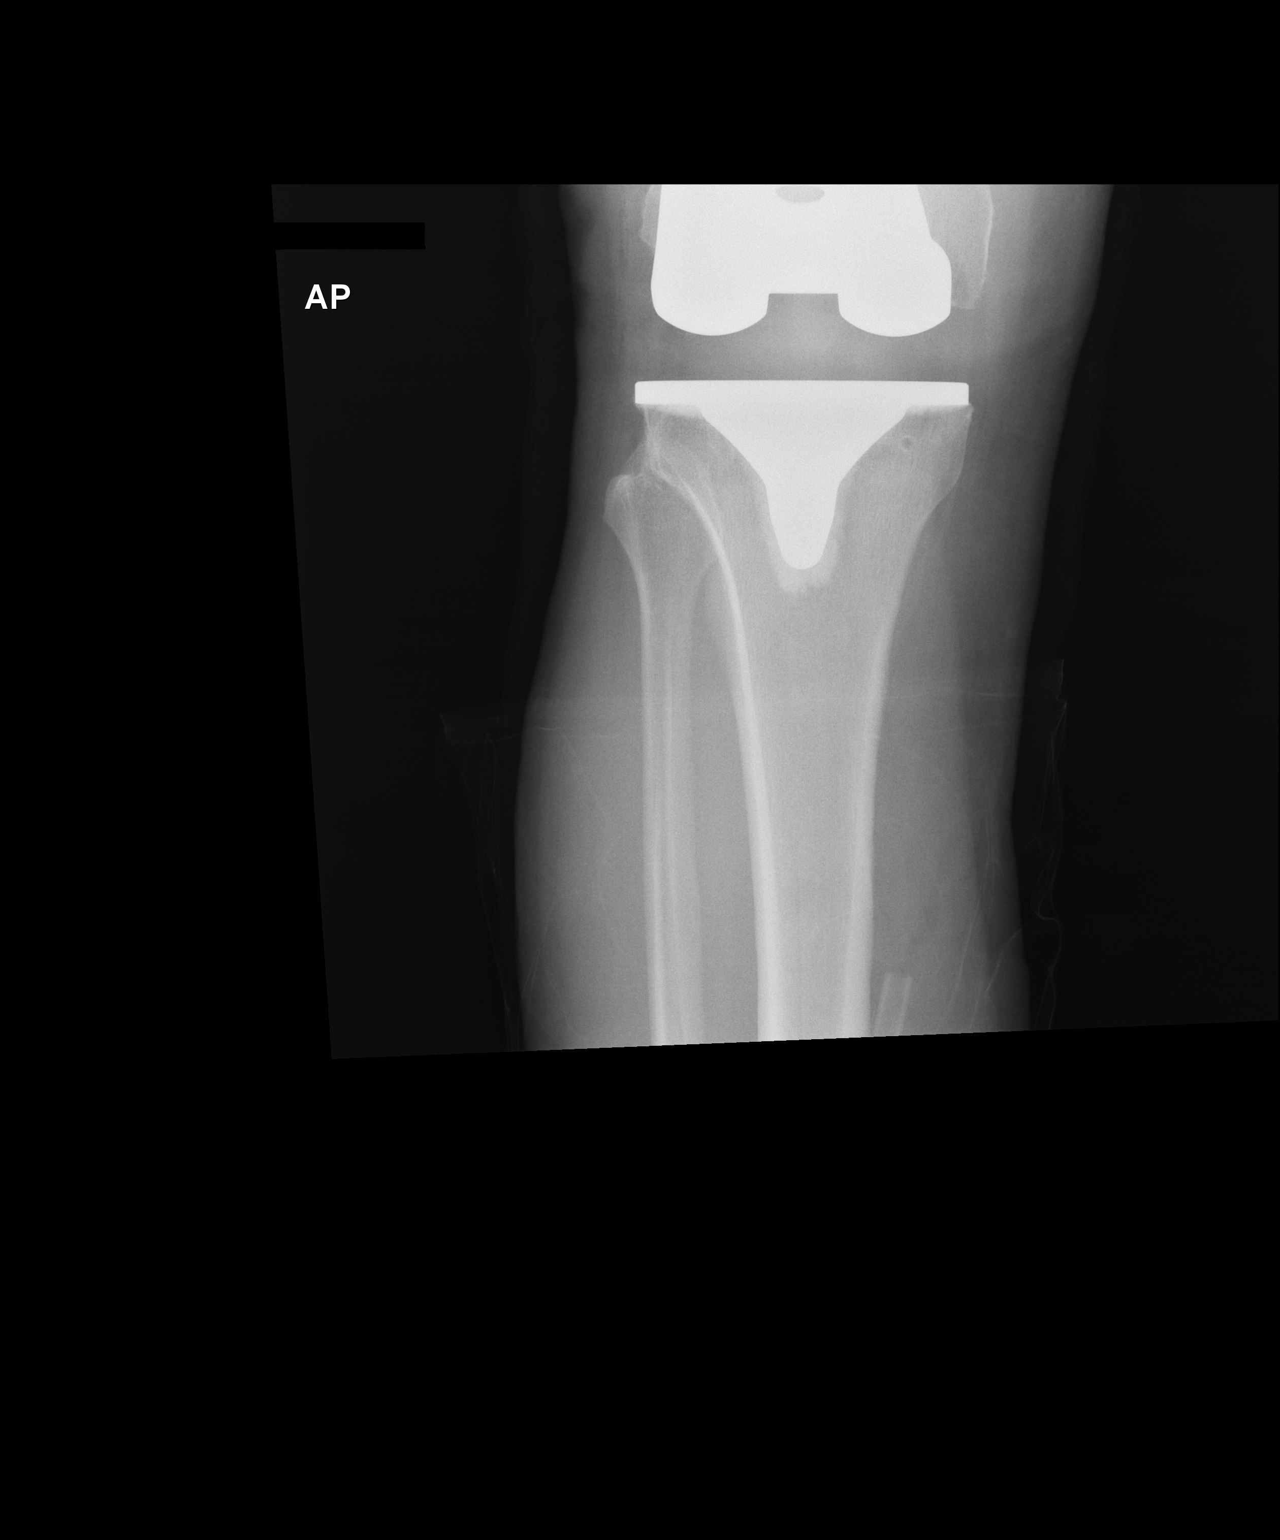

[xtable lateral (2 of 2)]
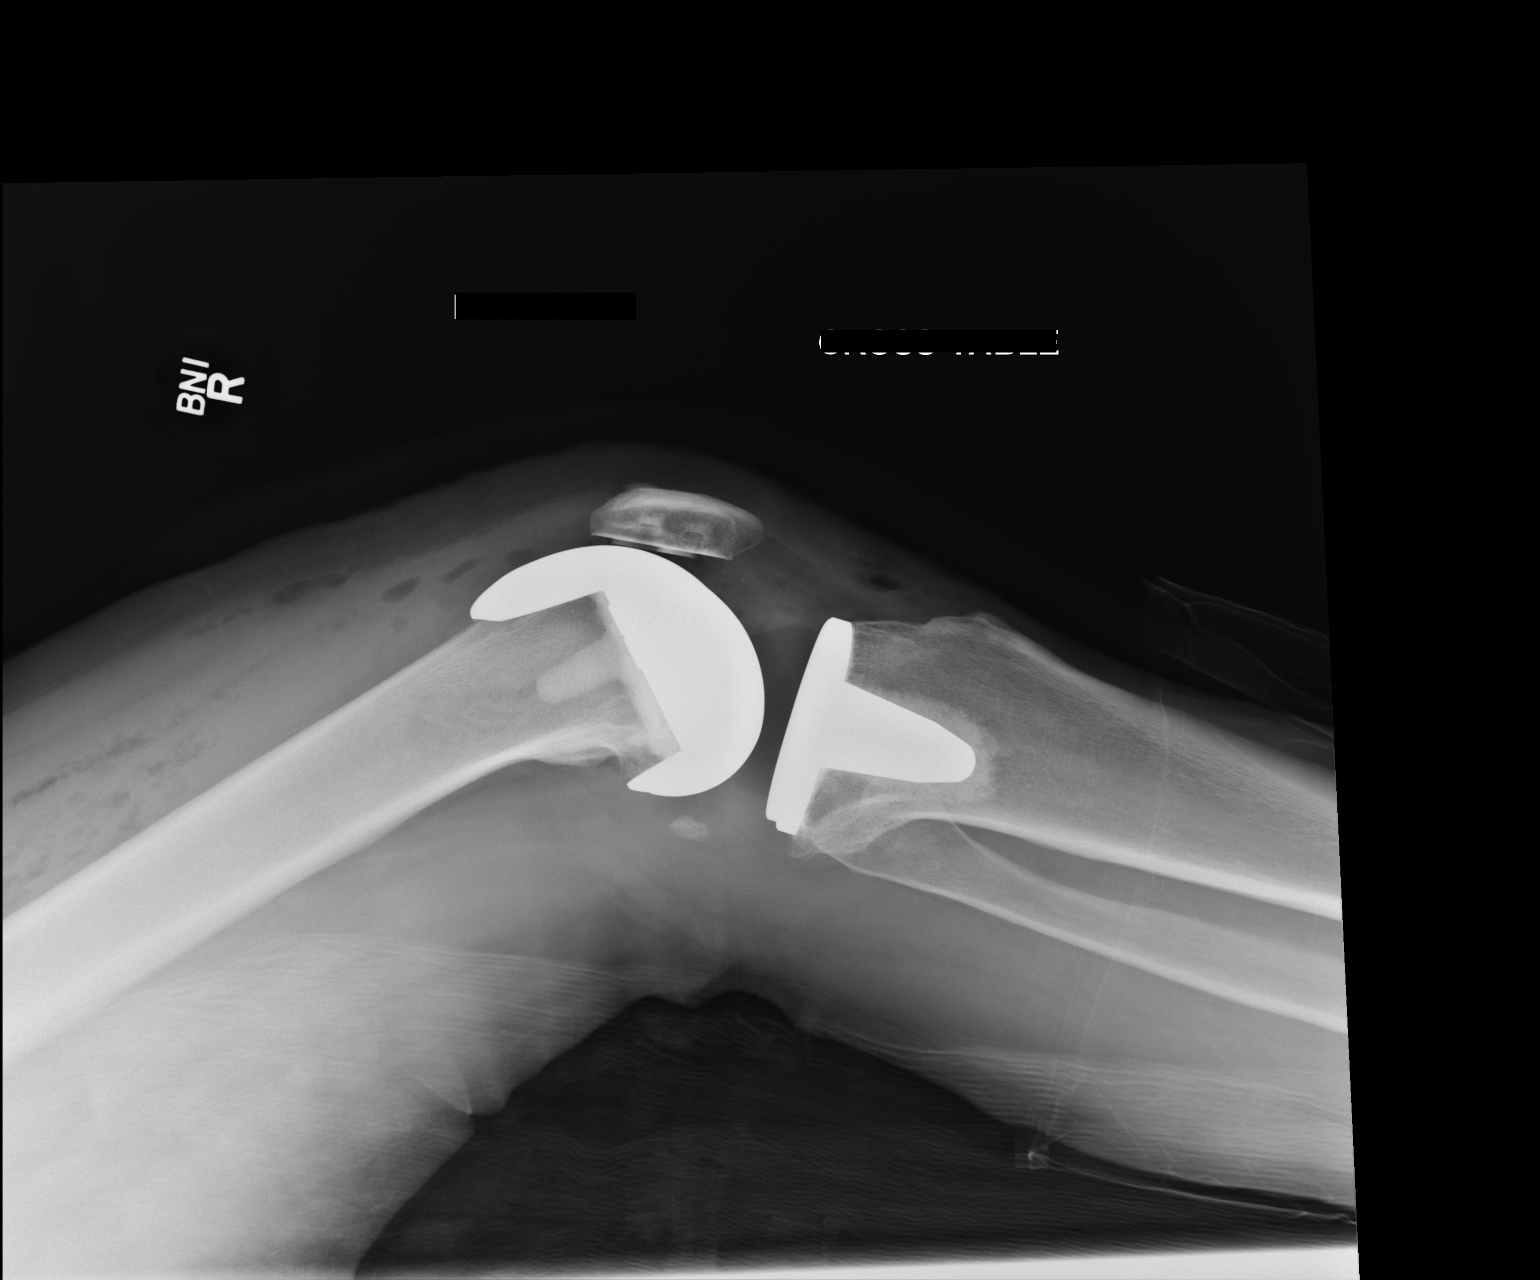

[2 of 2 positions shown; findings below may reference images not displayed]

FINDINGS: Two-view portable exam of the right knee shows the patient to be
status post tricompartmental knee replacement. No evidence for
immediate hardware complications. Gas within the overlying soft
tissues is compatible with the immediate postoperative state.
IMPRESSION: No evidence for immediate hardware complications.

## 2018-07-31 ENCOUNTER — Encounter: Payer: Self-pay | Admitting: Gastroenterology

## 2018-08-12 ENCOUNTER — Telehealth (INDEPENDENT_AMBULATORY_CARE_PROVIDER_SITE_OTHER): Payer: Self-pay | Admitting: Gastroenterology

## 2018-08-12 ENCOUNTER — Encounter: Payer: Self-pay | Admitting: Gastroenterology

## 2018-08-12 ENCOUNTER — Other Ambulatory Visit: Payer: Self-pay

## 2018-08-12 VITALS — Ht 72.0 in | Wt 245.0 lb

## 2018-08-12 DIAGNOSIS — K219 Gastro-esophageal reflux disease without esophagitis: Secondary | ICD-10-CM

## 2018-08-12 DIAGNOSIS — Z1211 Encounter for screening for malignant neoplasm of colon: Secondary | ICD-10-CM

## 2018-08-12 NOTE — Patient Instructions (Addendum)
If you are age 62 or older, your body mass index should be between 23-30. Your Body mass index is 33.23 kg/m. If this is out of the aforementioned range listed, please consider follow up with your Primary Care Provider.  If you are age 37 or younger, your body mass index should be between 19-25. Your Body mass index is 33.23 kg/m. If this is out of the aformentioned range listed, please consider follow up with your Primary Care Provider.   You have been scheduled for an endoscopy and colonoscopy. Please follow the written instructions given to you at your visit today. Please pick up your prep supplies at the pharmacy within the next 1-3 days. If you use inhalers (even only as needed), please bring them with you on the day of your procedure. Your physician has requested that you go to www.startemmi.com and enter the access code given to you at your visit today. This web site gives a general overview about your procedure. However, you should still follow specific instructions given to you by our office regarding your preparation for the procedure.  I have attached a Pre Procedure Patient Acknowledgement form and a prepaid envelope, please initial and sign form and mail back in envelope.   Continue Omeprazole.   Thank you,  Dr. Jackquline Denmark

## 2018-08-12 NOTE — Progress Notes (Signed)
Chief Complaint:   Referring Provider:  Gala Lewandowsky, MD      ASSESSMENT AND PLAN;   #1.  Refractory GERD.  H/O cough.  Could represent extra esophageal manifestations of reflux.  #2.  Colorectal cancer screening.  Plan: -Continue omeprazole 20 mg p.o. once a day. -Proceed with EGD/colon with miralax in 3-4 weeks. Discussed risks & benefits. (Risks including rare perforation req laparotomy, bleeding after biopsies/polypectomy req blood transfusion, rare chance of missing neoplasms, risks of anesthesia/sedation). Benefits outweigh the risks. Patient agrees to proceed.    HPI:    Kyle Christensen is a 63 y.o. male  With longstanding history of reflux and form of occasional heartburn and regurgitation Better with cutting down on caffeine and omeprazole he started over a week ago.   Also had cough x 3-4 yrs Recent bronchitis - treated with steroids, antibiotics.  Had a negative chest x-ray.  He also had pulmonary function tests.  Results are pending.  Has not seen pulmonology.  No history of smoking.   Denies having any odynophagia or dysphagia.  Has history of fishbone getting stuck 4 years ago requiring endoscopy for removal.    No melena or hematochezia.  No recent weight loss.  No diarrhea or constipation.  No family history of colon cancer.  His dad had lung cancer but also had history of smoking.   Was scheduled for screening colonoscopy previously by Dr. Noberto Retort.  However, patient backed off.  At that time he recently had umbilical hernia repair. Past Medical History:  Diagnosis Date  . Arthritis    osteoarthritis  . Essential hypertension   . GERD (gastroesophageal reflux disease)   . HOH (hard of hearing)    reads lips    Past Surgical History:  Procedure Laterality Date  . DENTAL SURGERY  1991   replaced all teeth  . MANDIBLE FRACTURE SURGERY    . ORIF FACIAL FRACTURE     plate  . TOTAL KNEE ARTHROPLASTY Right 07/23/2013   Procedure: RIGHT TOTAL  KNEE ARTHROPLASTY;  Surgeon: Augustin Schooling, MD;  Location: [facility];  Service: Orthopedics;  Laterality: Right;  . TRACHEOSTOMY      Family History  Problem Relation Age of Onset  . Colon cancer Neg Hx     Social History   Tobacco Use  . Smoking status: Never Smoker  . Smokeless tobacco: Never Used  Substance Use Topics  . Alcohol use: No  . Drug use: No    Current Outpatient Medications  Medication Sig Dispense Refill  . ALPRAZolam (XANAX) 0.5 MG tablet Take 1 mg by mouth at bedtime.     . hydrochlorothiazide (HYDRODIURIL) 25 MG tablet Take 25 mg by mouth daily.    Marland Kitchen omeprazole (PRILOSEC) 20 MG capsule Take 20 mg by mouth daily.    . tamsulosin (FLOMAX) 0.4 MG CAPS capsule Take 0.4 mg by mouth daily after supper.     No current facility-administered medications for this visit.     Allergies  Allergen Reactions  . Percocet [Oxycodone-Acetaminophen] Other (See Comments)    Mouth ulcers    Review of Systems:  Constitutional: Denies fever, chills, diaphoresis, appetite change and fatigue.  HEENT: Denies photophobia, eye pain, redness, hearing loss, ear pain, congestion, sore throat, rhinorrhea, sneezing, mouth sores, neck pain, neck stiffness and tinnitus.   Respiratory: Denies SOB, DOE, cough, chest tightness,  and wheezing.   Cardiovascular: Denies chest pain, palpitations and leg swelling.  Genitourinary: Denies dysuria, urgency, frequency, hematuria, flank pain and  difficulty urinating.  Musculoskeletal: Denies myalgias, back pain, joint swelling, arthralgias and gait problem.  Skin: No rash.  Neurological: Denies dizziness, seizures, syncope, weakness, light-headedness, numbness and headaches.  Hematological: Denies adenopathy. Easy bruising, personal or family bleeding history  Psychiatric/Behavioral: Has anxiety or depression     Physical Exam:    Ht 6' (1.829 m)   Wt 245 lb (111.1 kg)   BMI 33.23 kg/m  Filed Weights   08/12/18 0820  Weight: 245 lb (111.1  kg)   Constitutional:  Well-developed, in no acute distress. Psychiatric: Normal mood and affect. Behavior is normal. televisit  Data Reviewed: I have personally reviewed following labs and imaging studies  Blood work from 08/23/2017:     -Normal CBC with hemoglobin 16.8, MCV 98 -CMP showed creatinine 1.37 with GFR 55 mL/min, Nl LFTs   This service was provided via doxy video-visit.  The patient was located at home.  The provider was located in office.  The patient did consent to this telephone visit and is aware of possible charges through their insurance for this visit.  The patient was referred by Dr. Laymond PurserSistasis.  The other persons participating in this telemedicine service were wife and their role was caregiver.  Time spent on call/coordination of care/review of records: 30 min   Edman Circleaj Timmie Dugue, MD 08/12/2018, 8:39 AM  Cc: Maris BergerSistasis, Rowena, MD

## 2018-08-18 ENCOUNTER — Telehealth: Payer: Self-pay | Admitting: Gastroenterology

## 2018-08-18 NOTE — Telephone Encounter (Signed)
Patient wife called and said that the patient does not want to have the colonoscopy done. I have rescheduled him to just have the EGD but will need new instructions. Can you call patient.

## 2018-08-18 NOTE — Telephone Encounter (Signed)
I have mailed patient new instructions, patient notified.

## 2018-09-01 ENCOUNTER — Encounter: Payer: Self-pay | Admitting: Gastroenterology

## 2018-09-09 ENCOUNTER — Telehealth: Payer: Self-pay | Admitting: Gastroenterology

## 2018-09-10 NOTE — Telephone Encounter (Signed)
Thanks for letting me know Hope he feels better  RG

## 2018-09-11 ENCOUNTER — Encounter: Payer: Self-pay | Admitting: Gastroenterology

## 2023-09-28 LAB — COLOGUARD: COLOGUARD: POSITIVE — AB

## 2024-02-08 ENCOUNTER — Encounter (HOSPITAL_BASED_OUTPATIENT_CLINIC_OR_DEPARTMENT_OTHER): Payer: Self-pay

## 2024-02-08 ENCOUNTER — Ambulatory Visit (HOSPITAL_BASED_OUTPATIENT_CLINIC_OR_DEPARTMENT_OTHER): Admission: EM | Admit: 2024-02-08 | Discharge: 2024-02-08 | Disposition: A | Discharge status: Home / Self Care

## 2024-02-08 DIAGNOSIS — M109 Gout, unspecified: Secondary | ICD-10-CM | POA: Diagnosis not present

## 2024-02-08 MED ORDER — PREDNISONE 20 MG PO TABS: 40.0000 mg | ORAL_TABLET | Freq: Every day | ORAL | 0 refills | Status: AC

## 2024-02-08 NOTE — ED Provider Notes (Signed)
 " [facility]    CSN: [id number] Arrival date & time: 02/08/24  9171      History   Chief Complaint Chief Complaint  Patient presents with   Foot Pain    HPI Kyle Christensen is a 69 y.o. male.   Pt is a 69 year old male that presents with left foot pain. Pain to top of left foot onset 2-3 days ago. Pain, erythema and swelling joint below the big toe. States throbs at night. Wife is in the hospital and patient has been doing a lot of walking from parking lot into the building over the last week. No injury     Foot Pain    Past Medical History:  Diagnosis Date   Arthritis    osteoarthritis   Essential hypertension    GERD (gastroesophageal reflux disease)    HOH (hard of hearing)    reads lips    Patient Active Problem List   Diagnosis Date Noted   Arthritis of knee, degenerative 07/23/2013    Past Surgical History:  Procedure Laterality Date   DENTAL SURGERY  1991   replaced all teeth   MANDIBLE FRACTURE SURGERY     ORIF FACIAL FRACTURE     plate   TOTAL KNEE ARTHROPLASTY Right 07/23/2013   Procedure: RIGHT TOTAL KNEE ARTHROPLASTY;  Surgeon: Elspeth JONELLE Her, MD;  Location: [facility];  Service: Orthopedics;  Laterality: Right;   TRACHEOSTOMY         Home Medications    Prior to Admission medications  Medication Sig Start Date End Date Taking? Authorizing Provider  predniSONE  (DELTASONE ) 20 MG tablet Take 2 tablets (40 mg total) by mouth daily with breakfast for 5 days. 02/08/24 02/13/24 Yes Eara Burruel A, FNP  ALPRAZolam  (XANAX ) 0.5 MG tablet Take 1 mg by mouth at bedtime.     [provider]  hydrochlorothiazide  (HYDRODIURIL ) 25 MG tablet Take 25 mg by mouth daily.    [provider]  losartan (COZAAR) 25 MG tablet Take 25 mg by mouth daily.    [provider]  omeprazole (PRILOSEC) 20 MG capsule Take 20 mg by mouth daily.    [provider]  tamsulosin  (FLOMAX ) 0.4 MG CAPS capsule Take 0.4 mg by mouth daily after  supper.    [provider]    Family History Family History  Problem Relation Age of Onset   Colon cancer Neg Hx     Social History Social History[1]   Allergies   Percocet [oxycodone -acetaminophen ]   Review of Systems Review of Systems  See HPI Physical Exam Triage Vital Signs ED Triage Vitals  Encounter Vitals Group     BP 02/08/24 0846 119/81     Girls Systolic BP Percentile --      Girls Diastolic BP Percentile --      Boys Systolic BP Percentile --      Boys Diastolic BP Percentile --      Pulse Rate 02/08/24 0846 67     Resp 02/08/24 0846 20     Temp 02/08/24 0846 98.1 F (36.7 C)     Temp Source 02/08/24 0846 Oral     SpO2 02/08/24 0846 95 %     Weight --      Height --      Head Circumference --      Peak Flow --      Pain Score 02/08/24 0848 2     Pain Loc --      Pain Education --  Exclude from Growth Chart --    No data found.  Updated Vital Signs BP 119/81 (BP Location: Right Arm)   Pulse 67   Temp 98.1 F (36.7 C) (Oral)   Resp 20   SpO2 95%   Visual Acuity Right Eye Distance:   Left Eye Distance:   Bilateral Distance:    Right Eye Near:   Left Eye Near:    Bilateral Near:     Physical Exam Constitutional:      Appearance: Normal appearance.  Pulmonary:     Effort: Pulmonary effort is normal.  Musculoskeletal:        General: Normal range of motion.     Left foot: Swelling, tenderness and bony tenderness present.       Legs:  Neurological:     Mental Status: He is alert.  Psychiatric:        Mood and Affect: Mood normal.      UC Treatments / Results  Labs (all labs ordered are listed, but only abnormal results are displayed) Labs Reviewed - No data to display  EKG   Radiology No results found.  Procedures Procedures (including critical care time)  Medications Ordered in UC Medications - No data to display  Initial Impression / Assessment and Plan / UC Course  I have reviewed the triage vital  signs and the nursing notes.  Pertinent labs & imaging results that were available during my care of the patient were reviewed by me and considered in my medical decision making (see chart for details).     Gout-exam consistent with gout.  Treating with prednisone .  Information given on gout and low purine diet.  Recommend follow-up PCP for any continued issues Final Clinical Impressions(s) / UC Diagnoses   Final diagnoses:  Acute gout involving toe of left foot, unspecified cause     Discharge Instructions      Treating you for gout.  Take the prednisone  as prescribed daily with food.  Try to stay off the foot is much as possible.  Rest, elevate.  Follow-up with your doctor as needed     ED Prescriptions     Medication Sig Dispense Auth. Provider   predniSONE  (DELTASONE ) 20 MG tablet Take 2 tablets (40 mg total) by mouth daily with breakfast for 5 days. 10 tablet Adah Wilbert LABOR, FNP      PDMP not reviewed this encounter.     [1]  Social History Tobacco Use   Smoking status: Never   Smokeless tobacco: Never  Vaping Use   Vaping status: Never Used  Substance Use Topics   Alcohol use: No   Drug use: No     Adah Wilbert LABOR, FNP 02/08/24 9078  "

## 2024-02-08 NOTE — Discharge Instructions (Signed)
 Treating you for gout.  Take the prednisone  as prescribed daily with food.  Try to stay off the foot is much as possible.  Rest, elevate.  Follow-up with your doctor as needed

## 2024-02-08 NOTE — ED Triage Notes (Addendum)
 Pain to top of left foot onset 2-3 days ago. No injury. States pain is along the top of his foot from area below his toes back toward the bend where his ankle is. States throbs at night. Wife is in the hospital and patient has been doing a lot of walking from parking lot into the building over the last week.
